# Patient Record
Sex: Female | Born: 1940
Health system: Southern US, Community
[De-identification: ages and names within clinical notes are randomized; demographics above are authoritative.]

## PROBLEM LIST (undated history)

## (undated) DIAGNOSIS — G309 Alzheimer's disease, unspecified: Secondary | ICD-10-CM

## (undated) DIAGNOSIS — I739 Peripheral vascular disease, unspecified: Secondary | ICD-10-CM

## (undated) DIAGNOSIS — E876 Hypokalemia: Secondary | ICD-10-CM

## (undated) DIAGNOSIS — Z86718 Personal history of other venous thrombosis and embolism: Secondary | ICD-10-CM

## (undated) DIAGNOSIS — Z7901 Long term (current) use of anticoagulants: Secondary | ICD-10-CM

## (undated) DIAGNOSIS — N3010 Interstitial cystitis (chronic) without hematuria: Secondary | ICD-10-CM

## (undated) DIAGNOSIS — I10 Essential (primary) hypertension: Secondary | ICD-10-CM

## (undated) DIAGNOSIS — J449 Chronic obstructive pulmonary disease, unspecified: Secondary | ICD-10-CM

## (undated) DIAGNOSIS — M810 Age-related osteoporosis without current pathological fracture: Secondary | ICD-10-CM

## (undated) DIAGNOSIS — R7303 Prediabetes: Secondary | ICD-10-CM

## (undated) DIAGNOSIS — F339 Major depressive disorder, recurrent, unspecified: Secondary | ICD-10-CM

## (undated) DIAGNOSIS — I499 Cardiac arrhythmia, unspecified: Secondary | ICD-10-CM

## (undated) HISTORY — DX: Alzheimer's disease, unspecified: G30.9

## (undated) HISTORY — DX: Essential (primary) hypertension: I10

## (undated) HISTORY — DX: Interstitial cystitis (chronic) without hematuria: N30.10

## (undated) HISTORY — DX: Major depressive disorder, recurrent, unspecified: F33.9

## (undated) HISTORY — DX: Chronic obstructive pulmonary disease, unspecified: J44.9

## (undated) HISTORY — DX: Peripheral vascular disease, unspecified: I73.9

## (undated) HISTORY — DX: Cardiac arrhythmia, unspecified: I49.9

## (undated) HISTORY — DX: Prediabetes: R73.03

## (undated) HISTORY — DX: Hypokalemia: E87.6

## (undated) HISTORY — DX: Long term (current) use of anticoagulants: Z79.01

## (undated) HISTORY — DX: Age-related osteoporosis without current pathological fracture: M81.0

## (undated) HISTORY — DX: Personal history of other venous thrombosis and embolism: Z86.718

---
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
## 1963-09-20 HISTORY — PX: TUBAL LIGATION: SHX77

## 2010-09-19 HISTORY — PX: CHOLECYSTECTOMY: SHX55

## 2015-11-05 DIAGNOSIS — Z86718 Personal history of other venous thrombosis and embolism: Secondary | ICD-10-CM | POA: Diagnosis not present

## 2016-10-04 DIAGNOSIS — Z0000 Encounter for general adult medical examination without abnormal findings: Secondary | ICD-10-CM | POA: Diagnosis not present

## 2016-10-10 DIAGNOSIS — Z0000 Encounter for general adult medical examination without abnormal findings: Secondary | ICD-10-CM | POA: Diagnosis not present

## 2016-10-10 DIAGNOSIS — Z6830 Body mass index (BMI) 30.0-30.9, adult: Secondary | ICD-10-CM | POA: Diagnosis not present

## 2016-10-10 DIAGNOSIS — E669 Obesity, unspecified: Secondary | ICD-10-CM | POA: Diagnosis not present

## 2016-12-27 DIAGNOSIS — Z0100 Encounter for examination of eyes and vision without abnormal findings: Secondary | ICD-10-CM | POA: Diagnosis not present

## 2016-12-27 DIAGNOSIS — H25813 Combined forms of age-related cataract, bilateral: Secondary | ICD-10-CM | POA: Diagnosis not present

## 2016-12-27 DIAGNOSIS — H04123 Dry eye syndrome of bilateral lacrimal glands: Secondary | ICD-10-CM | POA: Diagnosis not present

## 2016-12-29 DIAGNOSIS — J309 Allergic rhinitis, unspecified: Secondary | ICD-10-CM | POA: Diagnosis not present

## 2016-12-29 DIAGNOSIS — R42 Dizziness and giddiness: Secondary | ICD-10-CM | POA: Diagnosis not present

## 2016-12-29 DIAGNOSIS — E785 Hyperlipidemia, unspecified: Secondary | ICD-10-CM | POA: Diagnosis not present

## 2016-12-29 DIAGNOSIS — E669 Obesity, unspecified: Secondary | ICD-10-CM | POA: Diagnosis not present

## 2016-12-29 DIAGNOSIS — I10 Essential (primary) hypertension: Secondary | ICD-10-CM | POA: Diagnosis not present

## 2016-12-29 DIAGNOSIS — Z6830 Body mass index (BMI) 30.0-30.9, adult: Secondary | ICD-10-CM | POA: Diagnosis not present

## 2016-12-29 DIAGNOSIS — E039 Hypothyroidism, unspecified: Secondary | ICD-10-CM | POA: Diagnosis not present

## 2017-01-23 DIAGNOSIS — Z1231 Encounter for screening mammogram for malignant neoplasm of breast: Secondary | ICD-10-CM | POA: Diagnosis not present

## 2017-01-26 DIAGNOSIS — M8588 Other specified disorders of bone density and structure, other site: Secondary | ICD-10-CM | POA: Diagnosis not present

## 2017-01-26 DIAGNOSIS — Z6830 Body mass index (BMI) 30.0-30.9, adult: Secondary | ICD-10-CM | POA: Diagnosis not present

## 2017-01-26 DIAGNOSIS — K219 Gastro-esophageal reflux disease without esophagitis: Secondary | ICD-10-CM | POA: Diagnosis not present

## 2017-01-26 DIAGNOSIS — M25512 Pain in left shoulder: Secondary | ICD-10-CM | POA: Diagnosis not present

## 2017-01-26 DIAGNOSIS — E785 Hyperlipidemia, unspecified: Secondary | ICD-10-CM | POA: Diagnosis not present

## 2017-01-26 DIAGNOSIS — I10 Essential (primary) hypertension: Secondary | ICD-10-CM | POA: Diagnosis not present

## 2017-01-26 DIAGNOSIS — E669 Obesity, unspecified: Secondary | ICD-10-CM | POA: Diagnosis not present

## 2017-01-26 DIAGNOSIS — E039 Hypothyroidism, unspecified: Secondary | ICD-10-CM | POA: Diagnosis not present

## 2017-01-26 DIAGNOSIS — E559 Vitamin D deficiency, unspecified: Secondary | ICD-10-CM | POA: Diagnosis not present

## 2017-02-10 DIAGNOSIS — N959 Unspecified menopausal and perimenopausal disorder: Secondary | ICD-10-CM | POA: Diagnosis not present

## 2017-02-10 DIAGNOSIS — M810 Age-related osteoporosis without current pathological fracture: Secondary | ICD-10-CM | POA: Diagnosis not present

## 2017-02-16 DIAGNOSIS — E663 Overweight: Secondary | ICD-10-CM | POA: Diagnosis not present

## 2017-02-16 DIAGNOSIS — E039 Hypothyroidism, unspecified: Secondary | ICD-10-CM | POA: Diagnosis not present

## 2017-02-16 DIAGNOSIS — J45909 Unspecified asthma, uncomplicated: Secondary | ICD-10-CM | POA: Diagnosis not present

## 2017-02-16 DIAGNOSIS — I10 Essential (primary) hypertension: Secondary | ICD-10-CM | POA: Diagnosis not present

## 2017-02-16 DIAGNOSIS — Z79899 Other long term (current) drug therapy: Secondary | ICD-10-CM | POA: Diagnosis not present

## 2017-02-16 DIAGNOSIS — E785 Hyperlipidemia, unspecified: Secondary | ICD-10-CM | POA: Diagnosis not present

## 2017-02-16 DIAGNOSIS — Z6828 Body mass index (BMI) 28.0-28.9, adult: Secondary | ICD-10-CM | POA: Diagnosis not present

## 2017-02-16 DIAGNOSIS — R69 Illness, unspecified: Secondary | ICD-10-CM | POA: Diagnosis not present

## 2017-03-29 DIAGNOSIS — M79672 Pain in left foot: Secondary | ICD-10-CM | POA: Diagnosis not present

## 2017-03-29 DIAGNOSIS — M79675 Pain in left toe(s): Secondary | ICD-10-CM | POA: Diagnosis not present

## 2017-03-29 DIAGNOSIS — E669 Obesity, unspecified: Secondary | ICD-10-CM | POA: Diagnosis not present

## 2017-03-29 DIAGNOSIS — Z79899 Other long term (current) drug therapy: Secondary | ICD-10-CM | POA: Diagnosis not present

## 2017-03-29 DIAGNOSIS — M7732 Calcaneal spur, left foot: Secondary | ICD-10-CM | POA: Diagnosis not present

## 2017-03-29 DIAGNOSIS — M7989 Other specified soft tissue disorders: Secondary | ICD-10-CM | POA: Diagnosis not present

## 2017-03-29 DIAGNOSIS — Z6828 Body mass index (BMI) 28.0-28.9, adult: Secondary | ICD-10-CM | POA: Diagnosis not present

## 2017-03-29 DIAGNOSIS — M810 Age-related osteoporosis without current pathological fracture: Secondary | ICD-10-CM | POA: Diagnosis not present

## 2017-03-29 DIAGNOSIS — E785 Hyperlipidemia, unspecified: Secondary | ICD-10-CM | POA: Diagnosis not present

## 2017-03-29 DIAGNOSIS — G4700 Insomnia, unspecified: Secondary | ICD-10-CM | POA: Diagnosis not present

## 2017-03-29 DIAGNOSIS — E039 Hypothyroidism, unspecified: Secondary | ICD-10-CM | POA: Diagnosis not present

## 2017-03-29 DIAGNOSIS — J309 Allergic rhinitis, unspecified: Secondary | ICD-10-CM | POA: Diagnosis not present

## 2017-03-29 DIAGNOSIS — J439 Emphysema, unspecified: Secondary | ICD-10-CM | POA: Diagnosis not present

## 2017-05-15 DIAGNOSIS — I10 Essential (primary) hypertension: Secondary | ICD-10-CM | POA: Diagnosis not present

## 2017-05-15 DIAGNOSIS — Z0000 Encounter for general adult medical examination without abnormal findings: Secondary | ICD-10-CM | POA: Diagnosis not present

## 2017-05-15 DIAGNOSIS — E039 Hypothyroidism, unspecified: Secondary | ICD-10-CM | POA: Diagnosis not present

## 2017-05-15 DIAGNOSIS — E7800 Pure hypercholesterolemia, unspecified: Secondary | ICD-10-CM | POA: Diagnosis not present

## 2017-05-15 DIAGNOSIS — K219 Gastro-esophageal reflux disease without esophagitis: Secondary | ICD-10-CM | POA: Diagnosis not present

## 2017-05-15 DIAGNOSIS — R69 Illness, unspecified: Secondary | ICD-10-CM | POA: Diagnosis not present

## 2017-05-15 DIAGNOSIS — H8111 Benign paroxysmal vertigo, right ear: Secondary | ICD-10-CM | POA: Diagnosis not present

## 2017-05-15 DIAGNOSIS — J302 Other seasonal allergic rhinitis: Secondary | ICD-10-CM | POA: Diagnosis not present

## 2017-05-15 DIAGNOSIS — H9111 Presbycusis, right ear: Secondary | ICD-10-CM | POA: Diagnosis not present

## 2017-05-15 DIAGNOSIS — M0570 Rheumatoid arthritis with rheumatoid factor of unspecified site without organ or systems involvement: Secondary | ICD-10-CM | POA: Diagnosis not present

## 2017-06-19 DIAGNOSIS — Z23 Encounter for immunization: Secondary | ICD-10-CM | POA: Diagnosis not present

## 2017-06-19 DIAGNOSIS — H7291 Unspecified perforation of tympanic membrane, right ear: Secondary | ICD-10-CM | POA: Diagnosis not present

## 2017-06-19 DIAGNOSIS — Z6827 Body mass index (BMI) 27.0-27.9, adult: Secondary | ICD-10-CM | POA: Diagnosis not present

## 2017-07-31 DIAGNOSIS — E559 Vitamin D deficiency, unspecified: Secondary | ICD-10-CM | POA: Diagnosis not present

## 2017-07-31 DIAGNOSIS — M810 Age-related osteoporosis without current pathological fracture: Secondary | ICD-10-CM | POA: Diagnosis not present

## 2017-07-31 DIAGNOSIS — Z6827 Body mass index (BMI) 27.0-27.9, adult: Secondary | ICD-10-CM | POA: Diagnosis not present

## 2017-07-31 DIAGNOSIS — E669 Obesity, unspecified: Secondary | ICD-10-CM | POA: Diagnosis not present

## 2017-07-31 DIAGNOSIS — M79662 Pain in left lower leg: Secondary | ICD-10-CM | POA: Diagnosis not present

## 2017-07-31 DIAGNOSIS — E039 Hypothyroidism, unspecified: Secondary | ICD-10-CM | POA: Diagnosis not present

## 2017-07-31 DIAGNOSIS — E785 Hyperlipidemia, unspecified: Secondary | ICD-10-CM | POA: Diagnosis not present

## 2017-07-31 DIAGNOSIS — I10 Essential (primary) hypertension: Secondary | ICD-10-CM | POA: Diagnosis not present

## 2017-07-31 DIAGNOSIS — K219 Gastro-esophageal reflux disease without esophagitis: Secondary | ICD-10-CM | POA: Diagnosis not present

## 2017-07-31 DIAGNOSIS — Z23 Encounter for immunization: Secondary | ICD-10-CM | POA: Diagnosis not present

## 2017-08-04 DIAGNOSIS — M79605 Pain in left leg: Secondary | ICD-10-CM | POA: Diagnosis not present

## 2017-08-04 DIAGNOSIS — M7989 Other specified soft tissue disorders: Secondary | ICD-10-CM | POA: Diagnosis not present

## 2017-10-21 DIAGNOSIS — M25562 Pain in left knee: Secondary | ICD-10-CM | POA: Diagnosis not present

## 2017-10-21 DIAGNOSIS — W19XXXA Unspecified fall, initial encounter: Secondary | ICD-10-CM | POA: Diagnosis not present

## 2017-10-21 DIAGNOSIS — S0512XA Contusion of eyeball and orbital tissues, left eye, initial encounter: Secondary | ICD-10-CM | POA: Diagnosis not present

## 2017-10-21 DIAGNOSIS — S8002XA Contusion of left knee, initial encounter: Secondary | ICD-10-CM | POA: Diagnosis not present

## 2017-10-21 DIAGNOSIS — S0990XA Unspecified injury of head, initial encounter: Secondary | ICD-10-CM | POA: Diagnosis not present

## 2017-10-21 DIAGNOSIS — S8992XA Unspecified injury of left lower leg, initial encounter: Secondary | ICD-10-CM | POA: Diagnosis not present

## 2017-10-21 DIAGNOSIS — S50312A Abrasion of left elbow, initial encounter: Secondary | ICD-10-CM | POA: Diagnosis not present

## 2017-10-22 DIAGNOSIS — S0990XA Unspecified injury of head, initial encounter: Secondary | ICD-10-CM | POA: Diagnosis not present

## 2017-10-22 DIAGNOSIS — M25562 Pain in left knee: Secondary | ICD-10-CM | POA: Diagnosis not present

## 2017-10-22 DIAGNOSIS — S8992XA Unspecified injury of left lower leg, initial encounter: Secondary | ICD-10-CM | POA: Diagnosis not present

## 2017-11-28 DIAGNOSIS — Z6826 Body mass index (BMI) 26.0-26.9, adult: Secondary | ICD-10-CM | POA: Diagnosis not present

## 2017-11-28 DIAGNOSIS — E039 Hypothyroidism, unspecified: Secondary | ICD-10-CM | POA: Diagnosis not present

## 2017-11-28 DIAGNOSIS — J309 Allergic rhinitis, unspecified: Secondary | ICD-10-CM | POA: Diagnosis not present

## 2017-11-28 DIAGNOSIS — E663 Overweight: Secondary | ICD-10-CM | POA: Diagnosis not present

## 2017-11-28 DIAGNOSIS — J45909 Unspecified asthma, uncomplicated: Secondary | ICD-10-CM | POA: Diagnosis not present

## 2017-11-28 DIAGNOSIS — Z833 Family history of diabetes mellitus: Secondary | ICD-10-CM | POA: Diagnosis not present

## 2017-11-28 DIAGNOSIS — E785 Hyperlipidemia, unspecified: Secondary | ICD-10-CM | POA: Diagnosis not present

## 2017-11-28 DIAGNOSIS — J439 Emphysema, unspecified: Secondary | ICD-10-CM | POA: Diagnosis not present

## 2017-11-28 DIAGNOSIS — I10 Essential (primary) hypertension: Secondary | ICD-10-CM | POA: Diagnosis not present

## 2017-11-28 DIAGNOSIS — M25552 Pain in left hip: Secondary | ICD-10-CM | POA: Diagnosis not present

## 2017-11-28 DIAGNOSIS — R413 Other amnesia: Secondary | ICD-10-CM | POA: Diagnosis not present

## 2017-11-28 DIAGNOSIS — R739 Hyperglycemia, unspecified: Secondary | ICD-10-CM | POA: Diagnosis not present

## 2017-11-28 DIAGNOSIS — Z1331 Encounter for screening for depression: Secondary | ICD-10-CM | POA: Diagnosis not present

## 2017-11-28 DIAGNOSIS — R202 Paresthesia of skin: Secondary | ICD-10-CM | POA: Diagnosis not present

## 2017-11-28 DIAGNOSIS — Z79899 Other long term (current) drug therapy: Secondary | ICD-10-CM | POA: Diagnosis not present

## 2017-11-28 DIAGNOSIS — R69 Illness, unspecified: Secondary | ICD-10-CM | POA: Diagnosis not present

## 2017-11-28 DIAGNOSIS — M1612 Unilateral primary osteoarthritis, left hip: Secondary | ICD-10-CM | POA: Diagnosis not present

## 2018-01-01 DIAGNOSIS — H524 Presbyopia: Secondary | ICD-10-CM | POA: Diagnosis not present

## 2018-01-16 DIAGNOSIS — J209 Acute bronchitis, unspecified: Secondary | ICD-10-CM | POA: Diagnosis not present

## 2018-01-16 DIAGNOSIS — Z86718 Personal history of other venous thrombosis and embolism: Secondary | ICD-10-CM | POA: Diagnosis not present

## 2018-01-16 DIAGNOSIS — J019 Acute sinusitis, unspecified: Secondary | ICD-10-CM | POA: Diagnosis not present

## 2018-01-16 DIAGNOSIS — E663 Overweight: Secondary | ICD-10-CM | POA: Diagnosis not present

## 2018-01-16 DIAGNOSIS — J309 Allergic rhinitis, unspecified: Secondary | ICD-10-CM | POA: Diagnosis not present

## 2018-01-16 DIAGNOSIS — Z6826 Body mass index (BMI) 26.0-26.9, adult: Secondary | ICD-10-CM | POA: Diagnosis not present

## 2018-02-13 DIAGNOSIS — J439 Emphysema, unspecified: Secondary | ICD-10-CM | POA: Diagnosis not present

## 2018-02-13 DIAGNOSIS — H04129 Dry eye syndrome of unspecified lacrimal gland: Secondary | ICD-10-CM | POA: Diagnosis not present

## 2018-02-13 DIAGNOSIS — E039 Hypothyroidism, unspecified: Secondary | ICD-10-CM | POA: Diagnosis not present

## 2018-02-13 DIAGNOSIS — R69 Illness, unspecified: Secondary | ICD-10-CM | POA: Diagnosis not present

## 2018-02-13 DIAGNOSIS — K08409 Partial loss of teeth, unspecified cause, unspecified class: Secondary | ICD-10-CM | POA: Diagnosis not present

## 2018-02-13 DIAGNOSIS — J309 Allergic rhinitis, unspecified: Secondary | ICD-10-CM | POA: Diagnosis not present

## 2018-02-13 DIAGNOSIS — E785 Hyperlipidemia, unspecified: Secondary | ICD-10-CM | POA: Diagnosis not present

## 2018-02-13 DIAGNOSIS — G8929 Other chronic pain: Secondary | ICD-10-CM | POA: Diagnosis not present

## 2018-02-13 DIAGNOSIS — M069 Rheumatoid arthritis, unspecified: Secondary | ICD-10-CM | POA: Diagnosis not present

## 2018-03-26 DIAGNOSIS — Z1231 Encounter for screening mammogram for malignant neoplasm of breast: Secondary | ICD-10-CM | POA: Diagnosis not present

## 2018-03-29 DIAGNOSIS — I10 Essential (primary) hypertension: Secondary | ICD-10-CM | POA: Diagnosis not present

## 2018-03-29 DIAGNOSIS — Z1339 Encounter for screening examination for other mental health and behavioral disorders: Secondary | ICD-10-CM | POA: Diagnosis not present

## 2018-03-29 DIAGNOSIS — R69 Illness, unspecified: Secondary | ICD-10-CM | POA: Diagnosis not present

## 2018-03-29 DIAGNOSIS — E785 Hyperlipidemia, unspecified: Secondary | ICD-10-CM | POA: Diagnosis not present

## 2018-03-29 DIAGNOSIS — Z6826 Body mass index (BMI) 26.0-26.9, adult: Secondary | ICD-10-CM | POA: Diagnosis not present

## 2018-03-29 DIAGNOSIS — R7303 Prediabetes: Secondary | ICD-10-CM | POA: Diagnosis not present

## 2018-03-29 DIAGNOSIS — E039 Hypothyroidism, unspecified: Secondary | ICD-10-CM | POA: Diagnosis not present

## 2018-03-29 DIAGNOSIS — E663 Overweight: Secondary | ICD-10-CM | POA: Diagnosis not present

## 2018-03-29 DIAGNOSIS — R413 Other amnesia: Secondary | ICD-10-CM | POA: Diagnosis not present

## 2018-03-29 DIAGNOSIS — J309 Allergic rhinitis, unspecified: Secondary | ICD-10-CM | POA: Diagnosis not present

## 2018-07-31 DIAGNOSIS — Z79899 Other long term (current) drug therapy: Secondary | ICD-10-CM | POA: Diagnosis not present

## 2018-07-31 DIAGNOSIS — I10 Essential (primary) hypertension: Secondary | ICD-10-CM | POA: Diagnosis not present

## 2018-07-31 DIAGNOSIS — E039 Hypothyroidism, unspecified: Secondary | ICD-10-CM | POA: Diagnosis not present

## 2018-07-31 DIAGNOSIS — Z23 Encounter for immunization: Secondary | ICD-10-CM | POA: Diagnosis not present

## 2018-07-31 DIAGNOSIS — Z6828 Body mass index (BMI) 28.0-28.9, adult: Secondary | ICD-10-CM | POA: Diagnosis not present

## 2018-07-31 DIAGNOSIS — E785 Hyperlipidemia, unspecified: Secondary | ICD-10-CM | POA: Diagnosis not present

## 2018-07-31 DIAGNOSIS — R69 Illness, unspecified: Secondary | ICD-10-CM | POA: Diagnosis not present

## 2018-07-31 DIAGNOSIS — M810 Age-related osteoporosis without current pathological fracture: Secondary | ICD-10-CM | POA: Diagnosis not present

## 2018-07-31 DIAGNOSIS — R7303 Prediabetes: Secondary | ICD-10-CM | POA: Diagnosis not present

## 2018-07-31 DIAGNOSIS — Z9181 History of falling: Secondary | ICD-10-CM | POA: Diagnosis not present

## 2018-09-17 DIAGNOSIS — R05 Cough: Secondary | ICD-10-CM | POA: Diagnosis not present

## 2018-09-17 DIAGNOSIS — Z6828 Body mass index (BMI) 28.0-28.9, adult: Secondary | ICD-10-CM | POA: Diagnosis not present

## 2018-09-17 DIAGNOSIS — J209 Acute bronchitis, unspecified: Secondary | ICD-10-CM | POA: Diagnosis not present

## 2018-09-17 DIAGNOSIS — R69 Illness, unspecified: Secondary | ICD-10-CM | POA: Diagnosis not present

## 2018-09-17 DIAGNOSIS — R42 Dizziness and giddiness: Secondary | ICD-10-CM | POA: Diagnosis not present

## 2018-09-17 DIAGNOSIS — R6889 Other general symptoms and signs: Secondary | ICD-10-CM | POA: Diagnosis not present

## 2018-09-19 HISTORY — PX: WRIST SURGERY: SHX841

## 2018-09-27 DIAGNOSIS — J209 Acute bronchitis, unspecified: Secondary | ICD-10-CM | POA: Diagnosis not present

## 2018-09-27 DIAGNOSIS — R05 Cough: Secondary | ICD-10-CM | POA: Diagnosis not present

## 2018-09-27 DIAGNOSIS — Z79899 Other long term (current) drug therapy: Secondary | ICD-10-CM | POA: Diagnosis not present

## 2018-11-29 DIAGNOSIS — R69 Illness, unspecified: Secondary | ICD-10-CM | POA: Diagnosis not present

## 2018-11-29 DIAGNOSIS — J439 Emphysema, unspecified: Secondary | ICD-10-CM | POA: Diagnosis not present

## 2018-11-29 DIAGNOSIS — K219 Gastro-esophageal reflux disease without esophagitis: Secondary | ICD-10-CM | POA: Diagnosis not present

## 2018-11-29 DIAGNOSIS — M069 Rheumatoid arthritis, unspecified: Secondary | ICD-10-CM | POA: Diagnosis not present

## 2018-11-29 DIAGNOSIS — M810 Age-related osteoporosis without current pathological fracture: Secondary | ICD-10-CM | POA: Diagnosis not present

## 2018-11-29 DIAGNOSIS — E785 Hyperlipidemia, unspecified: Secondary | ICD-10-CM | POA: Diagnosis not present

## 2018-11-29 DIAGNOSIS — E559 Vitamin D deficiency, unspecified: Secondary | ICD-10-CM | POA: Diagnosis not present

## 2018-11-29 DIAGNOSIS — R7303 Prediabetes: Secondary | ICD-10-CM | POA: Diagnosis not present

## 2018-11-29 DIAGNOSIS — E039 Hypothyroidism, unspecified: Secondary | ICD-10-CM | POA: Diagnosis not present

## 2018-11-29 DIAGNOSIS — I10 Essential (primary) hypertension: Secondary | ICD-10-CM | POA: Diagnosis not present

## 2018-12-06 DIAGNOSIS — L578 Other skin changes due to chronic exposure to nonionizing radiation: Secondary | ICD-10-CM | POA: Diagnosis not present

## 2018-12-06 DIAGNOSIS — L814 Other melanin hyperpigmentation: Secondary | ICD-10-CM | POA: Diagnosis not present

## 2018-12-06 DIAGNOSIS — D1801 Hemangioma of skin and subcutaneous tissue: Secondary | ICD-10-CM | POA: Diagnosis not present

## 2018-12-06 DIAGNOSIS — D485 Neoplasm of uncertain behavior of skin: Secondary | ICD-10-CM | POA: Diagnosis not present

## 2019-02-13 DIAGNOSIS — J029 Acute pharyngitis, unspecified: Secondary | ICD-10-CM | POA: Diagnosis not present

## 2019-03-05 DIAGNOSIS — E039 Hypothyroidism, unspecified: Secondary | ICD-10-CM | POA: Diagnosis not present

## 2019-03-05 DIAGNOSIS — K219 Gastro-esophageal reflux disease without esophagitis: Secondary | ICD-10-CM | POA: Diagnosis not present

## 2019-03-05 DIAGNOSIS — E785 Hyperlipidemia, unspecified: Secondary | ICD-10-CM | POA: Diagnosis not present

## 2019-03-05 DIAGNOSIS — I10 Essential (primary) hypertension: Secondary | ICD-10-CM | POA: Diagnosis not present

## 2019-03-05 DIAGNOSIS — G4700 Insomnia, unspecified: Secondary | ICD-10-CM | POA: Diagnosis not present

## 2019-03-05 DIAGNOSIS — R69 Illness, unspecified: Secondary | ICD-10-CM | POA: Diagnosis not present

## 2019-03-05 DIAGNOSIS — N649 Disorder of breast, unspecified: Secondary | ICD-10-CM | POA: Diagnosis not present

## 2019-03-05 DIAGNOSIS — R239 Unspecified skin changes: Secondary | ICD-10-CM | POA: Diagnosis not present

## 2019-03-05 DIAGNOSIS — L989 Disorder of the skin and subcutaneous tissue, unspecified: Secondary | ICD-10-CM | POA: Diagnosis not present

## 2019-03-05 DIAGNOSIS — R42 Dizziness and giddiness: Secondary | ICD-10-CM | POA: Diagnosis not present

## 2019-03-08 DIAGNOSIS — Z79899 Other long term (current) drug therapy: Secondary | ICD-10-CM | POA: Diagnosis not present

## 2019-03-08 DIAGNOSIS — E785 Hyperlipidemia, unspecified: Secondary | ICD-10-CM | POA: Diagnosis not present

## 2019-03-08 DIAGNOSIS — E559 Vitamin D deficiency, unspecified: Secondary | ICD-10-CM | POA: Diagnosis not present

## 2019-03-08 DIAGNOSIS — E039 Hypothyroidism, unspecified: Secondary | ICD-10-CM | POA: Diagnosis not present

## 2019-04-01 DIAGNOSIS — Z1231 Encounter for screening mammogram for malignant neoplasm of breast: Secondary | ICD-10-CM | POA: Diagnosis not present

## 2019-04-05 DIAGNOSIS — Z9181 History of falling: Secondary | ICD-10-CM | POA: Diagnosis not present

## 2019-04-05 DIAGNOSIS — Z139 Encounter for screening, unspecified: Secondary | ICD-10-CM | POA: Diagnosis not present

## 2019-04-05 DIAGNOSIS — Z1331 Encounter for screening for depression: Secondary | ICD-10-CM | POA: Diagnosis not present

## 2019-04-05 DIAGNOSIS — Z0000 Encounter for general adult medical examination without abnormal findings: Secondary | ICD-10-CM | POA: Diagnosis not present

## 2019-04-05 DIAGNOSIS — E785 Hyperlipidemia, unspecified: Secondary | ICD-10-CM | POA: Diagnosis not present

## 2019-04-29 DIAGNOSIS — J45909 Unspecified asthma, uncomplicated: Secondary | ICD-10-CM | POA: Diagnosis not present

## 2019-04-29 DIAGNOSIS — R7303 Prediabetes: Secondary | ICD-10-CM | POA: Diagnosis not present

## 2019-04-29 DIAGNOSIS — Z131 Encounter for screening for diabetes mellitus: Secondary | ICD-10-CM | POA: Diagnosis not present

## 2019-04-29 DIAGNOSIS — G4700 Insomnia, unspecified: Secondary | ICD-10-CM | POA: Diagnosis not present

## 2019-04-29 DIAGNOSIS — E559 Vitamin D deficiency, unspecified: Secondary | ICD-10-CM | POA: Diagnosis not present

## 2019-04-29 DIAGNOSIS — E039 Hypothyroidism, unspecified: Secondary | ICD-10-CM | POA: Diagnosis not present

## 2019-04-29 DIAGNOSIS — Z6828 Body mass index (BMI) 28.0-28.9, adult: Secondary | ICD-10-CM | POA: Diagnosis not present

## 2019-04-29 DIAGNOSIS — E663 Overweight: Secondary | ICD-10-CM | POA: Diagnosis not present

## 2019-04-29 DIAGNOSIS — J309 Allergic rhinitis, unspecified: Secondary | ICD-10-CM | POA: Diagnosis not present

## 2019-05-28 DIAGNOSIS — R635 Abnormal weight gain: Secondary | ICD-10-CM | POA: Diagnosis not present

## 2019-05-28 DIAGNOSIS — E039 Hypothyroidism, unspecified: Secondary | ICD-10-CM | POA: Diagnosis not present

## 2019-05-28 DIAGNOSIS — E559 Vitamin D deficiency, unspecified: Secondary | ICD-10-CM | POA: Diagnosis not present

## 2019-05-31 DIAGNOSIS — E7800 Pure hypercholesterolemia, unspecified: Secondary | ICD-10-CM | POA: Diagnosis not present

## 2019-05-31 DIAGNOSIS — E559 Vitamin D deficiency, unspecified: Secondary | ICD-10-CM | POA: Diagnosis not present

## 2019-05-31 DIAGNOSIS — K219 Gastro-esophageal reflux disease without esophagitis: Secondary | ICD-10-CM | POA: Diagnosis not present

## 2019-05-31 DIAGNOSIS — Z1331 Encounter for screening for depression: Secondary | ICD-10-CM | POA: Diagnosis not present

## 2019-05-31 DIAGNOSIS — E039 Hypothyroidism, unspecified: Secondary | ICD-10-CM | POA: Diagnosis not present

## 2019-05-31 DIAGNOSIS — Z6829 Body mass index (BMI) 29.0-29.9, adult: Secondary | ICD-10-CM | POA: Diagnosis not present

## 2019-05-31 DIAGNOSIS — Z1339 Encounter for screening examination for other mental health and behavioral disorders: Secondary | ICD-10-CM | POA: Diagnosis not present

## 2019-05-31 DIAGNOSIS — N951 Menopausal and female climacteric states: Secondary | ICD-10-CM | POA: Diagnosis not present

## 2019-06-04 DIAGNOSIS — E559 Vitamin D deficiency, unspecified: Secondary | ICD-10-CM | POA: Diagnosis not present

## 2019-06-04 DIAGNOSIS — Z79899 Other long term (current) drug therapy: Secondary | ICD-10-CM | POA: Diagnosis not present

## 2019-06-04 DIAGNOSIS — E538 Deficiency of other specified B group vitamins: Secondary | ICD-10-CM | POA: Diagnosis not present

## 2019-06-04 DIAGNOSIS — M810 Age-related osteoporosis without current pathological fracture: Secondary | ICD-10-CM | POA: Diagnosis not present

## 2019-06-04 DIAGNOSIS — Z780 Asymptomatic menopausal state: Secondary | ICD-10-CM | POA: Diagnosis not present

## 2019-06-04 DIAGNOSIS — E039 Hypothyroidism, unspecified: Secondary | ICD-10-CM | POA: Diagnosis not present

## 2019-06-04 DIAGNOSIS — J309 Allergic rhinitis, unspecified: Secondary | ICD-10-CM | POA: Diagnosis not present

## 2019-06-13 DIAGNOSIS — S79911A Unspecified injury of right hip, initial encounter: Secondary | ICD-10-CM | POA: Diagnosis not present

## 2019-06-13 DIAGNOSIS — E7800 Pure hypercholesterolemia, unspecified: Secondary | ICD-10-CM | POA: Diagnosis not present

## 2019-06-13 DIAGNOSIS — W19XXXA Unspecified fall, initial encounter: Secondary | ICD-10-CM | POA: Diagnosis not present

## 2019-06-13 DIAGNOSIS — M25551 Pain in right hip: Secondary | ICD-10-CM | POA: Diagnosis not present

## 2019-06-13 DIAGNOSIS — Z6829 Body mass index (BMI) 29.0-29.9, adult: Secondary | ICD-10-CM | POA: Diagnosis not present

## 2019-06-19 DIAGNOSIS — Z6829 Body mass index (BMI) 29.0-29.9, adult: Secondary | ICD-10-CM | POA: Diagnosis not present

## 2019-06-19 DIAGNOSIS — E559 Vitamin D deficiency, unspecified: Secondary | ICD-10-CM | POA: Diagnosis not present

## 2019-07-03 DIAGNOSIS — E7800 Pure hypercholesterolemia, unspecified: Secondary | ICD-10-CM | POA: Diagnosis not present

## 2019-07-03 DIAGNOSIS — N951 Menopausal and female climacteric states: Secondary | ICD-10-CM | POA: Diagnosis not present

## 2019-07-03 DIAGNOSIS — Z6828 Body mass index (BMI) 28.0-28.9, adult: Secondary | ICD-10-CM | POA: Diagnosis not present

## 2019-07-09 DIAGNOSIS — M25551 Pain in right hip: Secondary | ICD-10-CM | POA: Diagnosis not present

## 2019-07-09 DIAGNOSIS — S79911A Unspecified injury of right hip, initial encounter: Secondary | ICD-10-CM | POA: Diagnosis not present

## 2019-07-09 DIAGNOSIS — S8391XA Sprain of unspecified site of right knee, initial encounter: Secondary | ICD-10-CM | POA: Diagnosis not present

## 2019-07-09 DIAGNOSIS — W19XXXA Unspecified fall, initial encounter: Secondary | ICD-10-CM | POA: Diagnosis not present

## 2019-07-09 DIAGNOSIS — S83511A Sprain of anterior cruciate ligament of right knee, initial encounter: Secondary | ICD-10-CM | POA: Diagnosis not present

## 2019-07-09 DIAGNOSIS — R52 Pain, unspecified: Secondary | ICD-10-CM | POA: Diagnosis not present

## 2019-07-09 DIAGNOSIS — M25461 Effusion, right knee: Secondary | ICD-10-CM | POA: Diagnosis not present

## 2019-07-09 DIAGNOSIS — R0902 Hypoxemia: Secondary | ICD-10-CM | POA: Diagnosis not present

## 2019-07-09 DIAGNOSIS — M25561 Pain in right knee: Secondary | ICD-10-CM | POA: Diagnosis not present

## 2019-07-16 DIAGNOSIS — M1711 Unilateral primary osteoarthritis, right knee: Secondary | ICD-10-CM | POA: Diagnosis not present

## 2019-07-17 DIAGNOSIS — E039 Hypothyroidism, unspecified: Secondary | ICD-10-CM | POA: Diagnosis not present

## 2019-07-17 DIAGNOSIS — Z6828 Body mass index (BMI) 28.0-28.9, adult: Secondary | ICD-10-CM | POA: Diagnosis not present

## 2019-07-24 DIAGNOSIS — E039 Hypothyroidism, unspecified: Secondary | ICD-10-CM | POA: Diagnosis not present

## 2019-07-24 DIAGNOSIS — Z6828 Body mass index (BMI) 28.0-28.9, adult: Secondary | ICD-10-CM | POA: Diagnosis not present

## 2019-07-31 DIAGNOSIS — Z6828 Body mass index (BMI) 28.0-28.9, adult: Secondary | ICD-10-CM | POA: Diagnosis not present

## 2019-07-31 DIAGNOSIS — E7800 Pure hypercholesterolemia, unspecified: Secondary | ICD-10-CM | POA: Diagnosis not present

## 2019-07-31 DIAGNOSIS — E559 Vitamin D deficiency, unspecified: Secondary | ICD-10-CM | POA: Diagnosis not present

## 2019-08-07 DIAGNOSIS — E7800 Pure hypercholesterolemia, unspecified: Secondary | ICD-10-CM | POA: Diagnosis not present

## 2019-08-07 DIAGNOSIS — Z6828 Body mass index (BMI) 28.0-28.9, adult: Secondary | ICD-10-CM | POA: Diagnosis not present

## 2019-08-09 DIAGNOSIS — Z23 Encounter for immunization: Secondary | ICD-10-CM | POA: Diagnosis not present

## 2019-08-14 DIAGNOSIS — E7800 Pure hypercholesterolemia, unspecified: Secondary | ICD-10-CM | POA: Diagnosis not present

## 2019-08-14 DIAGNOSIS — Z6827 Body mass index (BMI) 27.0-27.9, adult: Secondary | ICD-10-CM | POA: Diagnosis not present

## 2019-08-20 DIAGNOSIS — E559 Vitamin D deficiency, unspecified: Secondary | ICD-10-CM | POA: Diagnosis not present

## 2019-08-20 DIAGNOSIS — Z6827 Body mass index (BMI) 27.0-27.9, adult: Secondary | ICD-10-CM | POA: Diagnosis not present

## 2019-08-28 DIAGNOSIS — K219 Gastro-esophageal reflux disease without esophagitis: Secondary | ICD-10-CM | POA: Diagnosis not present

## 2019-08-28 DIAGNOSIS — Z6827 Body mass index (BMI) 27.0-27.9, adult: Secondary | ICD-10-CM | POA: Diagnosis not present

## 2019-08-28 DIAGNOSIS — R232 Flushing: Secondary | ICD-10-CM | POA: Diagnosis not present

## 2019-08-28 DIAGNOSIS — Z713 Dietary counseling and surveillance: Secondary | ICD-10-CM | POA: Diagnosis not present

## 2019-08-28 DIAGNOSIS — E7800 Pure hypercholesterolemia, unspecified: Secondary | ICD-10-CM | POA: Diagnosis not present

## 2019-09-04 DIAGNOSIS — R69 Illness, unspecified: Secondary | ICD-10-CM | POA: Diagnosis not present

## 2019-09-04 DIAGNOSIS — Z6827 Body mass index (BMI) 27.0-27.9, adult: Secondary | ICD-10-CM | POA: Diagnosis not present

## 2019-09-04 DIAGNOSIS — E7800 Pure hypercholesterolemia, unspecified: Secondary | ICD-10-CM | POA: Diagnosis not present

## 2019-09-16 ENCOUNTER — Emergency Department (HOSPITAL_COMMUNITY): Payer: Medicare HMO

## 2019-09-16 ENCOUNTER — Other Ambulatory Visit: Payer: Self-pay

## 2019-09-16 ENCOUNTER — Emergency Department (HOSPITAL_COMMUNITY)
Admission: EM | Admit: 2019-09-16 | Discharge: 2019-09-16 | Disposition: A | Discharge status: Home / Self Care | Payer: Medicare HMO | Attending: Emergency Medicine | Admitting: Emergency Medicine

## 2019-09-16 DIAGNOSIS — S6991XA Unspecified injury of right wrist, hand and finger(s), initial encounter: Secondary | ICD-10-CM | POA: Diagnosis present

## 2019-09-16 DIAGNOSIS — R52 Pain, unspecified: Secondary | ICD-10-CM | POA: Diagnosis not present

## 2019-09-16 DIAGNOSIS — S52591A Other fractures of lower end of right radius, initial encounter for closed fracture: Secondary | ICD-10-CM | POA: Diagnosis not present

## 2019-09-16 DIAGNOSIS — S52122A Displaced fracture of head of left radius, initial encounter for closed fracture: Secondary | ICD-10-CM | POA: Diagnosis not present

## 2019-09-16 DIAGNOSIS — S0990XA Unspecified injury of head, initial encounter: Secondary | ICD-10-CM | POA: Diagnosis not present

## 2019-09-16 DIAGNOSIS — R0902 Hypoxemia: Secondary | ICD-10-CM | POA: Diagnosis not present

## 2019-09-16 DIAGNOSIS — S82391A Other fracture of lower end of right tibia, initial encounter for closed fracture: Secondary | ICD-10-CM | POA: Diagnosis not present

## 2019-09-16 DIAGNOSIS — Y92198 Other place in other specified residential institution as the place of occurrence of the external cause: Secondary | ICD-10-CM | POA: Diagnosis not present

## 2019-09-16 DIAGNOSIS — W19XXXA Unspecified fall, initial encounter: Secondary | ICD-10-CM | POA: Diagnosis not present

## 2019-09-16 DIAGNOSIS — S0003XA Contusion of scalp, initial encounter: Secondary | ICD-10-CM | POA: Diagnosis not present

## 2019-09-16 DIAGNOSIS — W01198A Fall on same level from slipping, tripping and stumbling with subsequent striking against other object, initial encounter: Secondary | ICD-10-CM | POA: Insufficient documentation

## 2019-09-16 DIAGNOSIS — S62101A Fracture of unspecified carpal bone, right wrist, initial encounter for closed fracture: Secondary | ICD-10-CM | POA: Diagnosis not present

## 2019-09-16 DIAGNOSIS — Y999 Unspecified external cause status: Secondary | ICD-10-CM | POA: Insufficient documentation

## 2019-09-16 DIAGNOSIS — S0083XA Contusion of other part of head, initial encounter: Secondary | ICD-10-CM | POA: Diagnosis not present

## 2019-09-16 DIAGNOSIS — Y939 Activity, unspecified: Secondary | ICD-10-CM | POA: Diagnosis not present

## 2019-09-16 DIAGNOSIS — S199XXA Unspecified injury of neck, initial encounter: Secondary | ICD-10-CM | POA: Diagnosis not present

## 2019-09-16 DIAGNOSIS — S52501A Unspecified fracture of the lower end of right radius, initial encounter for closed fracture: Secondary | ICD-10-CM | POA: Diagnosis not present

## 2019-09-16 DIAGNOSIS — R609 Edema, unspecified: Secondary | ICD-10-CM | POA: Diagnosis not present

## 2019-09-16 DIAGNOSIS — S0081XA Abrasion of other part of head, initial encounter: Secondary | ICD-10-CM | POA: Diagnosis not present

## 2019-09-16 MED ORDER — ONDANSETRON HCL 4 MG/2ML IJ SOLN
4.0000 mg | Freq: Once | INTRAMUSCULAR | Status: AC
  Administered 2019-09-16: 4 mg via INTRAVENOUS
  Filled 2019-09-16: qty 2

## 2019-09-16 MED ORDER — PROPOFOL 10 MG/ML IV BOLUS
0.5000 mg/kg | Freq: Once | INTRAVENOUS | Status: DC
  Filled 2019-09-16: qty 20

## 2019-09-16 MED ORDER — OXYCODONE HCL 5 MG PO TABS: 5.0000 mg | ORAL_TABLET | Freq: Four times a day (QID) | ORAL | 0 refills | Status: DC | PRN

## 2019-09-16 MED ORDER — LIDOCAINE HCL (PF) 1 % IJ SOLN
5.0000 mL | Freq: Once | INTRAMUSCULAR | Status: AC
  Administered 2019-09-16: 5 mL
  Filled 2019-09-16: qty 5

## 2019-09-16 MED ORDER — SODIUM CHLORIDE 0.9 % IV BOLUS: 1000.0000 mL | Freq: Once | INTRAVENOUS | Status: DC

## 2019-09-16 MED ORDER — OXYCODONE HCL 5 MG PO TABS
5.0000 mg | ORAL_TABLET | Freq: Once | ORAL | Status: AC
  Administered 2019-09-16: 5 mg via ORAL
  Filled 2019-09-16: qty 1

## 2019-09-16 NOTE — Discharge Instructions (Signed)
Follow-up with Dr. Lenon Curt for wrist fracture.  Follow-up with your primary care doctor for CT scan of your chest to further evaluate for pulmonary nodule as we discussed.  Use Tylenol for pain use narcotic pain medicine for breakthrough pain.

## 2019-09-16 NOTE — Progress Notes (Signed)
Orthopedic Tech Progress Note Patient Details:  Brittany Aguilar 04-27-1941 [id number] APPLIED A SUGARTONG SPLINT ON WHILE SHE WAS STILL IN THE FINGER TRAP PER MD REQUEST. Ortho Devices Type of Ortho Device: Arm sling, Finger trap, Sugartong splint Ortho Device/Splint Location: RUE Ortho Device/Splint Interventions: Adjustment, Application, Ordered   Post Interventions Patient Tolerated: Well Instructions Provided: Care of device, Adjustment of device   Janit Pagan 09/16/2019, 5:54 PM

## 2019-09-16 NOTE — ED Notes (Signed)
Pt unable to sign esignature pad due to being right handed, and having right hand fracture

## 2019-09-16 NOTE — ED Provider Notes (Signed)
[facility] Provider Note   CSN: [id number] Arrival date & time: 09/16/19  1433     History Chief Complaint  Patient presents with  . Wrist Pain    Brittany Aguilar is a 78 y.o. female.  Patient with mechanical fall prior to arrival.  Hit her head on a tree and landed on her right wrist.  Has deformity to the right wrist.  Denies any loss consciousness.  Patient is not on blood thinners.  The history is provided by the patient.  Wrist Pain This is a new problem. The current episode started 1 to 2 hours ago. The problem occurs constantly. The problem has not changed since onset.Pertinent negatives include no chest pain, no abdominal pain, no headaches and no shortness of breath. Nothing aggravates the symptoms. Nothing relieves the symptoms. She has tried nothing for the symptoms. The treatment provided no relief.       No past medical history on file.  There are no problems to display for this patient.     OB History   No obstetric history on file.     No family history on file.  Social History   Tobacco Use  . Smoking status: Not on file  Substance Use Topics  . Alcohol use: Not on file  . Drug use: Not on file    Home Medications Prior to Admission medications   Not on File    Allergies    Oxycodone  Review of Systems   Review of Systems  Constitutional: Negative for chills and fever.  HENT: Negative for ear pain and sore throat.   Eyes: Negative for pain and visual disturbance.  Respiratory: Negative for cough and shortness of breath.   Cardiovascular: Negative for chest pain and palpitations.  Gastrointestinal: Negative for abdominal pain and vomiting.  Genitourinary: Negative for dysuria and hematuria.  Musculoskeletal: Positive for arthralgias. Negative for back pain.  Skin: Positive for wound. Negative for color change and rash.  Neurological: Positive for numbness (right hand tingling ). Negative for  dizziness, tremors, seizures, syncope, facial asymmetry, speech difficulty, weakness, light-headedness and headaches.  All other systems reviewed and are negative.   Physical Exam Updated Vital Signs  ED Triage Vitals  Enc Vitals Group     BP 09/16/19 1439 (!) 154/48     Pulse Rate 09/16/19 1439 (!) 54     Resp 09/16/19 1439 14     Temp 09/16/19 1439 98.4 F (36.9 C)     Temp Source 09/16/19 1439 Oral     SpO2 09/16/19 1439 98 %     Weight --      Height --      Head Circumference --      Peak Flow --      Pain Score 09/16/19 1438 10     Pain Loc --      Pain Edu? --      Excl. in [place]? --     Physical Exam Vitals and nursing note reviewed.  Constitutional:      General: She is not in acute distress.    Appearance: She is well-developed.  HENT:     Head:     Comments: Bruising and swelling to the right forehead with abrasion    Nose: Nose normal.     Mouth/Throat:     Mouth: Mucous membranes are moist.  Eyes:     Extraocular Movements: Extraocular movements intact.     Conjunctiva/sclera: Conjunctivae normal.  Pupils: Pupils are equal, round, and reactive to light.  Cardiovascular:     Rate and Rhythm: Normal rate and regular rhythm.     Pulses: Normal pulses.     Heart sounds: Normal heart sounds. No murmur.  Pulmonary:     Effort: Pulmonary effort is normal. No respiratory distress.     Breath sounds: Normal breath sounds.  Abdominal:     Palpations: Abdomen is soft.     Tenderness: There is no abdominal tenderness.  Musculoskeletal:        General: Swelling, tenderness and deformity present.     Cervical back: Normal range of motion and neck supple.     Comments: Bruising and swelling to right wrist with deformity, no midline spinal tenderness  Skin:    General: Skin is warm and dry.     Capillary Refill: Capillary refill takes less than 2 seconds.  Neurological:     General: No focal deficit present.     Mental Status: She is alert.     Sensory: No  sensory deficit.     Motor: No weakness.     Comments: Motor function and sensation intact in right upper extremity  Psychiatric:        Mood and Affect: Mood normal.     ED Results / Procedures / Treatments   Labs (all labs ordered are listed, but only abnormal results are displayed) Labs Reviewed - No data to display  EKG None  Radiology DG Wrist Complete Right  Result Date: 09/16/2019 CLINICAL DATA:  fall, wrist deformity/pain. Pt stated that she was stepping up onto the sidewalk and she tripped putting her hands out in front of her and slammed her right hand into a tree. When pt first arrived she said her fingers were starting to feel cold. EXAM: RIGHT WRIST - COMPLETE 3+ VIEW COMPARISON:  None. FINDINGS: There is a comminuted, impacted and dorsally displaced fracture of the distal radius. Primary fracture is transverse across the metaphysis with secondary fractures that extend to the articular surface. The impaction is predominantly dorsal which leads to dorsal angulation of the distal articular surface of 38 degrees. There is associated fracture of the ulnar styloid, minimally displaced in a radial direction. No dislocation. There is surrounding soft tissue swelling. IMPRESSION: 1. Comminuted, dorsally impacted and angulated intra-articular fracture of the distal radius with an associated ulnar styloid fracture. No dislocation. Electronically Signed   By: Lajean Manes M.D.   On: 09/16/2019 15:15    Procedures .Ortho Injury Treatment  Date/Time: 09/16/2019 7:17 PM Performed by: Lennice Sites, DO Authorized by: Lennice Sites, DO   Consent:    Consent obtained:  Written   Consent given by:  Patient   Risks discussed:  Irreducible dislocation, fracture, nerve damage, recurrent dislocation, restricted joint movement, stiffness and vascular damage   Alternatives discussed:  Delayed treatmentInjury location: wrist Location details: right wrist Injury type: fracture Fracture  type: distal radius Pre-procedure neurovascular assessment: neurovascularly intact Pre-procedure distal perfusion: normal Pre-procedure neurological function: normal Pre-procedure range of motion: normal Anesthesia: hematoma block  Anesthesia: Local anesthesia used: yes Local Anesthetic: lidocaine 1% without epinephrine Anesthetic total: 5 mL  Patient sedated: NoManipulation performed: yes Skin traction used: yes Skeletal traction used: yes Reduction successful: yes X-ray confirmed reduction: yes Immobilization: splint Splint type: sugar tong Supplies used: plaster Post-procedure neurovascular assessment: post-procedure neurovascularly intact Post-procedure distal perfusion: normal Post-procedure neurological function: normal Post-procedure range of motion: normal Patient tolerance: patient tolerated the procedure well with no immediate complications  Comments: Slight improvement on post reduction film    (including critical care time)  Medications Ordered in ED Medications  lidocaine (PF) (XYLOCAINE) 1 % injection 5 mL (5 mLs Infiltration Given 09/16/19 1538)  oxyCODONE (Oxy IR/ROXICODONE) immediate release tablet 5 mg (5 mg Oral Given 09/16/19 1527)    ED Course  I have reviewed the triage vital signs and the nursing notes.  Pertinent labs & imaging results that were available during my care of the patient were reviewed by me and considered in my medical decision making (see chart for details).    MDM Rules/Calculators/A&P  Betsey Holiday Delbuono    Final Clinical Impression(s) / ED Diagnoses Final diagnoses:  None    Rx / DC Orders ED Discharge Orders    None       Lennice Sites, DO 09/16/19 2358

## 2019-09-16 NOTE — ED Triage Notes (Signed)
Pt here via [facility], tripped and fell while trying to step up onto the sidewalk . Hit head on tree, hematoma and abrasions noted to R side of head. Did not pass out, no dizziness prior to fall. Deformity to R wrist. No on blood thinners.

## 2019-09-16 NOTE — ED Notes (Signed)
Patient verbalizes understanding of discharge instructions. Opportunity for questioning and answers were provided. Armband removed by staff, pt discharged from ED with son to pick up with assistance from tech into vehicle

## 2019-09-18 DIAGNOSIS — S62101A Fracture of unspecified carpal bone, right wrist, initial encounter for closed fracture: Secondary | ICD-10-CM | POA: Diagnosis not present

## 2019-09-25 DIAGNOSIS — S52531A Colles' fracture of right radius, initial encounter for closed fracture: Secondary | ICD-10-CM | POA: Diagnosis not present

## 2019-09-27 DIAGNOSIS — M25531 Pain in right wrist: Secondary | ICD-10-CM | POA: Diagnosis not present

## 2019-09-27 DIAGNOSIS — Y929 Unspecified place or not applicable: Secondary | ICD-10-CM | POA: Diagnosis not present

## 2019-09-27 DIAGNOSIS — Y999 Unspecified external cause status: Secondary | ICD-10-CM | POA: Diagnosis not present

## 2019-09-27 DIAGNOSIS — S52572A Other intraarticular fracture of lower end of left radius, initial encounter for closed fracture: Secondary | ICD-10-CM | POA: Diagnosis not present

## 2019-09-27 DIAGNOSIS — S52531A Colles' fracture of right radius, initial encounter for closed fracture: Secondary | ICD-10-CM | POA: Diagnosis not present

## 2019-09-27 DIAGNOSIS — X58XXXA Exposure to other specified factors, initial encounter: Secondary | ICD-10-CM | POA: Diagnosis not present

## 2019-09-27 DIAGNOSIS — Y939 Activity, unspecified: Secondary | ICD-10-CM | POA: Diagnosis not present

## 2019-10-09 DIAGNOSIS — S52531D Colles' fracture of right radius, subsequent encounter for closed fracture with routine healing: Secondary | ICD-10-CM | POA: Diagnosis not present

## 2019-10-16 DIAGNOSIS — R42 Dizziness and giddiness: Secondary | ICD-10-CM | POA: Diagnosis not present

## 2019-10-16 DIAGNOSIS — Z6828 Body mass index (BMI) 28.0-28.9, adult: Secondary | ICD-10-CM | POA: Diagnosis not present

## 2019-10-16 DIAGNOSIS — S62101A Fracture of unspecified carpal bone, right wrist, initial encounter for closed fracture: Secondary | ICD-10-CM | POA: Diagnosis not present

## 2019-10-16 DIAGNOSIS — G4700 Insomnia, unspecified: Secondary | ICD-10-CM | POA: Diagnosis not present

## 2019-10-16 DIAGNOSIS — R69 Illness, unspecified: Secondary | ICD-10-CM | POA: Diagnosis not present

## 2019-10-16 DIAGNOSIS — J309 Allergic rhinitis, unspecified: Secondary | ICD-10-CM | POA: Diagnosis not present

## 2019-10-16 DIAGNOSIS — E663 Overweight: Secondary | ICD-10-CM | POA: Diagnosis not present

## 2019-10-16 DIAGNOSIS — I10 Essential (primary) hypertension: Secondary | ICD-10-CM | POA: Diagnosis not present

## 2019-10-16 DIAGNOSIS — R296 Repeated falls: Secondary | ICD-10-CM | POA: Diagnosis not present

## 2019-10-18 DIAGNOSIS — I10 Essential (primary) hypertension: Secondary | ICD-10-CM | POA: Diagnosis not present

## 2019-10-18 DIAGNOSIS — R296 Repeated falls: Secondary | ICD-10-CM | POA: Diagnosis not present

## 2019-10-18 DIAGNOSIS — M069 Rheumatoid arthritis, unspecified: Secondary | ICD-10-CM | POA: Diagnosis not present

## 2019-10-18 DIAGNOSIS — M159 Polyosteoarthritis, unspecified: Secondary | ICD-10-CM | POA: Diagnosis not present

## 2019-10-18 DIAGNOSIS — Z9181 History of falling: Secondary | ICD-10-CM | POA: Diagnosis not present

## 2019-10-18 DIAGNOSIS — H8110 Benign paroxysmal vertigo, unspecified ear: Secondary | ICD-10-CM | POA: Diagnosis not present

## 2019-10-18 DIAGNOSIS — G4700 Insomnia, unspecified: Secondary | ICD-10-CM | POA: Diagnosis not present

## 2019-10-18 DIAGNOSIS — M80031D Age-related osteoporosis with current pathological fracture, right forearm, subsequent encounter for fracture with routine healing: Secondary | ICD-10-CM | POA: Diagnosis not present

## 2019-10-18 DIAGNOSIS — J309 Allergic rhinitis, unspecified: Secondary | ICD-10-CM | POA: Diagnosis not present

## 2019-10-18 DIAGNOSIS — R69 Illness, unspecified: Secondary | ICD-10-CM | POA: Diagnosis not present

## 2019-11-08 DIAGNOSIS — R296 Repeated falls: Secondary | ICD-10-CM | POA: Diagnosis not present

## 2019-11-08 DIAGNOSIS — M159 Polyosteoarthritis, unspecified: Secondary | ICD-10-CM | POA: Diagnosis not present

## 2019-11-08 DIAGNOSIS — H8110 Benign paroxysmal vertigo, unspecified ear: Secondary | ICD-10-CM | POA: Diagnosis not present

## 2019-11-08 DIAGNOSIS — R69 Illness, unspecified: Secondary | ICD-10-CM | POA: Diagnosis not present

## 2019-11-08 DIAGNOSIS — J309 Allergic rhinitis, unspecified: Secondary | ICD-10-CM | POA: Diagnosis not present

## 2019-11-08 DIAGNOSIS — Z9181 History of falling: Secondary | ICD-10-CM | POA: Diagnosis not present

## 2019-11-08 DIAGNOSIS — M069 Rheumatoid arthritis, unspecified: Secondary | ICD-10-CM | POA: Diagnosis not present

## 2019-11-08 DIAGNOSIS — G4700 Insomnia, unspecified: Secondary | ICD-10-CM | POA: Diagnosis not present

## 2019-11-08 DIAGNOSIS — M80031D Age-related osteoporosis with current pathological fracture, right forearm, subsequent encounter for fracture with routine healing: Secondary | ICD-10-CM | POA: Diagnosis not present

## 2019-11-08 DIAGNOSIS — I10 Essential (primary) hypertension: Secondary | ICD-10-CM | POA: Diagnosis not present

## 2019-11-21 DIAGNOSIS — H7291 Unspecified perforation of tympanic membrane, right ear: Secondary | ICD-10-CM | POA: Diagnosis not present

## 2019-11-21 DIAGNOSIS — E039 Hypothyroidism, unspecified: Secondary | ICD-10-CM | POA: Diagnosis not present

## 2019-11-21 DIAGNOSIS — H905 Unspecified sensorineural hearing loss: Secondary | ICD-10-CM | POA: Diagnosis not present

## 2019-11-21 DIAGNOSIS — R42 Dizziness and giddiness: Secondary | ICD-10-CM | POA: Diagnosis not present

## 2019-11-21 DIAGNOSIS — R269 Unspecified abnormalities of gait and mobility: Secondary | ICD-10-CM | POA: Diagnosis not present

## 2019-11-21 DIAGNOSIS — H9071 Mixed conductive and sensorineural hearing loss, unilateral, right ear, with unrestricted hearing on the contralateral side: Secondary | ICD-10-CM | POA: Diagnosis not present

## 2019-12-04 DIAGNOSIS — Z2821 Immunization not carried out because of patient refusal: Secondary | ICD-10-CM | POA: Diagnosis not present

## 2019-12-04 DIAGNOSIS — S52531D Colles' fracture of right radius, subsequent encounter for closed fracture with routine healing: Secondary | ICD-10-CM | POA: Diagnosis not present

## 2019-12-04 DIAGNOSIS — E039 Hypothyroidism, unspecified: Secondary | ICD-10-CM | POA: Diagnosis not present

## 2019-12-04 DIAGNOSIS — Z6828 Body mass index (BMI) 28.0-28.9, adult: Secondary | ICD-10-CM | POA: Diagnosis not present

## 2019-12-04 DIAGNOSIS — E785 Hyperlipidemia, unspecified: Secondary | ICD-10-CM | POA: Diagnosis not present

## 2019-12-04 DIAGNOSIS — Z79899 Other long term (current) drug therapy: Secondary | ICD-10-CM | POA: Diagnosis not present

## 2019-12-04 DIAGNOSIS — E663 Overweight: Secondary | ICD-10-CM | POA: Diagnosis not present

## 2019-12-04 DIAGNOSIS — E559 Vitamin D deficiency, unspecified: Secondary | ICD-10-CM | POA: Diagnosis not present

## 2019-12-04 DIAGNOSIS — R42 Dizziness and giddiness: Secondary | ICD-10-CM | POA: Diagnosis not present

## 2019-12-05 DIAGNOSIS — E039 Hypothyroidism, unspecified: Secondary | ICD-10-CM | POA: Diagnosis not present

## 2019-12-05 DIAGNOSIS — E785 Hyperlipidemia, unspecified: Secondary | ICD-10-CM | POA: Diagnosis not present

## 2019-12-05 DIAGNOSIS — Z79899 Other long term (current) drug therapy: Secondary | ICD-10-CM | POA: Diagnosis not present

## 2019-12-05 DIAGNOSIS — E559 Vitamin D deficiency, unspecified: Secondary | ICD-10-CM | POA: Diagnosis not present

## 2020-01-13 DIAGNOSIS — R42 Dizziness and giddiness: Secondary | ICD-10-CM | POA: Diagnosis not present

## 2020-01-13 DIAGNOSIS — Z6828 Body mass index (BMI) 28.0-28.9, adult: Secondary | ICD-10-CM | POA: Diagnosis not present

## 2020-01-13 DIAGNOSIS — R69 Illness, unspecified: Secondary | ICD-10-CM | POA: Diagnosis not present

## 2020-01-13 DIAGNOSIS — G4700 Insomnia, unspecified: Secondary | ICD-10-CM | POA: Diagnosis not present

## 2020-01-13 DIAGNOSIS — E785 Hyperlipidemia, unspecified: Secondary | ICD-10-CM | POA: Diagnosis not present

## 2020-01-13 DIAGNOSIS — E559 Vitamin D deficiency, unspecified: Secondary | ICD-10-CM | POA: Diagnosis not present

## 2020-01-13 DIAGNOSIS — J309 Allergic rhinitis, unspecified: Secondary | ICD-10-CM | POA: Diagnosis not present

## 2020-01-13 DIAGNOSIS — R269 Unspecified abnormalities of gait and mobility: Secondary | ICD-10-CM | POA: Diagnosis not present

## 2020-01-13 DIAGNOSIS — I10 Essential (primary) hypertension: Secondary | ICD-10-CM | POA: Diagnosis not present

## 2020-01-13 DIAGNOSIS — E663 Overweight: Secondary | ICD-10-CM | POA: Diagnosis not present

## 2020-01-20 DIAGNOSIS — E785 Hyperlipidemia, unspecified: Secondary | ICD-10-CM | POA: Insufficient documentation

## 2020-01-20 DIAGNOSIS — E039 Hypothyroidism, unspecified: Secondary | ICD-10-CM

## 2020-01-20 DIAGNOSIS — R269 Unspecified abnormalities of gait and mobility: Secondary | ICD-10-CM | POA: Diagnosis not present

## 2020-01-20 DIAGNOSIS — K219 Gastro-esophageal reflux disease without esophagitis: Secondary | ICD-10-CM

## 2020-01-20 DIAGNOSIS — F419 Anxiety disorder, unspecified: Secondary | ICD-10-CM | POA: Insufficient documentation

## 2020-01-20 DIAGNOSIS — M069 Rheumatoid arthritis, unspecified: Secondary | ICD-10-CM | POA: Insufficient documentation

## 2020-01-20 DIAGNOSIS — R42 Dizziness and giddiness: Secondary | ICD-10-CM | POA: Diagnosis not present

## 2020-01-20 HISTORY — DX: Hypothyroidism, unspecified: E03.9

## 2020-01-20 HISTORY — DX: Gastro-esophageal reflux disease without esophagitis: K21.9

## 2020-01-20 HISTORY — DX: Anxiety disorder, unspecified: F41.9

## 2020-01-20 HISTORY — DX: Hyperlipidemia, unspecified: E78.5

## 2020-01-20 HISTORY — DX: Rheumatoid arthritis, unspecified: M06.9

## 2020-02-04 DIAGNOSIS — S0990XA Unspecified injury of head, initial encounter: Secondary | ICD-10-CM | POA: Diagnosis not present

## 2020-02-04 DIAGNOSIS — R269 Unspecified abnormalities of gait and mobility: Secondary | ICD-10-CM | POA: Diagnosis not present

## 2020-02-04 DIAGNOSIS — R42 Dizziness and giddiness: Secondary | ICD-10-CM | POA: Diagnosis not present

## 2020-02-18 DIAGNOSIS — I10 Essential (primary) hypertension: Secondary | ICD-10-CM | POA: Diagnosis not present

## 2020-02-18 DIAGNOSIS — E663 Overweight: Secondary | ICD-10-CM | POA: Diagnosis not present

## 2020-02-18 DIAGNOSIS — J309 Allergic rhinitis, unspecified: Secondary | ICD-10-CM | POA: Diagnosis not present

## 2020-02-18 DIAGNOSIS — R69 Illness, unspecified: Secondary | ICD-10-CM | POA: Diagnosis not present

## 2020-02-18 DIAGNOSIS — R269 Unspecified abnormalities of gait and mobility: Secondary | ICD-10-CM | POA: Diagnosis not present

## 2020-02-18 DIAGNOSIS — E785 Hyperlipidemia, unspecified: Secondary | ICD-10-CM | POA: Diagnosis not present

## 2020-02-18 DIAGNOSIS — Z6828 Body mass index (BMI) 28.0-28.9, adult: Secondary | ICD-10-CM | POA: Diagnosis not present

## 2020-02-18 DIAGNOSIS — E559 Vitamin D deficiency, unspecified: Secondary | ICD-10-CM | POA: Diagnosis not present

## 2020-02-18 DIAGNOSIS — R42 Dizziness and giddiness: Secondary | ICD-10-CM | POA: Diagnosis not present

## 2020-02-18 DIAGNOSIS — G4700 Insomnia, unspecified: Secondary | ICD-10-CM | POA: Diagnosis not present

## 2020-03-10 DIAGNOSIS — R42 Dizziness and giddiness: Secondary | ICD-10-CM | POA: Diagnosis not present

## 2020-04-08 DIAGNOSIS — Z9181 History of falling: Secondary | ICD-10-CM | POA: Diagnosis not present

## 2020-04-08 DIAGNOSIS — Z0000 Encounter for general adult medical examination without abnormal findings: Secondary | ICD-10-CM | POA: Diagnosis not present

## 2020-04-08 DIAGNOSIS — Z139 Encounter for screening, unspecified: Secondary | ICD-10-CM | POA: Diagnosis not present

## 2020-04-08 DIAGNOSIS — Z1331 Encounter for screening for depression: Secondary | ICD-10-CM | POA: Diagnosis not present

## 2020-04-08 DIAGNOSIS — E785 Hyperlipidemia, unspecified: Secondary | ICD-10-CM | POA: Diagnosis not present

## 2020-05-11 DIAGNOSIS — H9192 Unspecified hearing loss, left ear: Secondary | ICD-10-CM | POA: Diagnosis not present

## 2020-05-11 DIAGNOSIS — J029 Acute pharyngitis, unspecified: Secondary | ICD-10-CM | POA: Diagnosis not present

## 2020-05-11 DIAGNOSIS — G4700 Insomnia, unspecified: Secondary | ICD-10-CM | POA: Diagnosis not present

## 2020-05-11 DIAGNOSIS — R42 Dizziness and giddiness: Secondary | ICD-10-CM | POA: Diagnosis not present

## 2020-06-30 DIAGNOSIS — E663 Overweight: Secondary | ICD-10-CM | POA: Diagnosis not present

## 2020-06-30 DIAGNOSIS — Z23 Encounter for immunization: Secondary | ICD-10-CM | POA: Diagnosis not present

## 2020-06-30 DIAGNOSIS — M069 Rheumatoid arthritis, unspecified: Secondary | ICD-10-CM | POA: Diagnosis not present

## 2020-06-30 DIAGNOSIS — Z79899 Other long term (current) drug therapy: Secondary | ICD-10-CM | POA: Diagnosis not present

## 2020-06-30 DIAGNOSIS — E039 Hypothyroidism, unspecified: Secondary | ICD-10-CM | POA: Diagnosis not present

## 2020-06-30 DIAGNOSIS — E785 Hyperlipidemia, unspecified: Secondary | ICD-10-CM | POA: Diagnosis not present

## 2020-06-30 DIAGNOSIS — I10 Essential (primary) hypertension: Secondary | ICD-10-CM | POA: Diagnosis not present

## 2020-06-30 DIAGNOSIS — E559 Vitamin D deficiency, unspecified: Secondary | ICD-10-CM | POA: Diagnosis not present

## 2020-06-30 DIAGNOSIS — H8190 Unspecified disorder of vestibular function, unspecified ear: Secondary | ICD-10-CM | POA: Diagnosis not present

## 2020-06-30 DIAGNOSIS — Z6828 Body mass index (BMI) 28.0-28.9, adult: Secondary | ICD-10-CM | POA: Diagnosis not present

## 2020-07-02 DIAGNOSIS — H5213 Myopia, bilateral: Secondary | ICD-10-CM | POA: Diagnosis not present

## 2020-07-02 DIAGNOSIS — H524 Presbyopia: Secondary | ICD-10-CM | POA: Diagnosis not present

## 2020-08-17 DIAGNOSIS — J029 Acute pharyngitis, unspecified: Secondary | ICD-10-CM | POA: Diagnosis not present

## 2020-08-17 DIAGNOSIS — J309 Allergic rhinitis, unspecified: Secondary | ICD-10-CM | POA: Diagnosis not present

## 2020-08-17 DIAGNOSIS — J209 Acute bronchitis, unspecified: Secondary | ICD-10-CM | POA: Diagnosis not present

## 2020-08-17 DIAGNOSIS — J019 Acute sinusitis, unspecified: Secondary | ICD-10-CM | POA: Diagnosis not present

## 2020-10-13 DIAGNOSIS — Z6828 Body mass index (BMI) 28.0-28.9, adult: Secondary | ICD-10-CM | POA: Diagnosis not present

## 2020-10-13 DIAGNOSIS — I10 Essential (primary) hypertension: Secondary | ICD-10-CM | POA: Diagnosis not present

## 2020-10-13 DIAGNOSIS — H8190 Unspecified disorder of vestibular function, unspecified ear: Secondary | ICD-10-CM | POA: Diagnosis not present

## 2020-10-13 DIAGNOSIS — E559 Vitamin D deficiency, unspecified: Secondary | ICD-10-CM | POA: Diagnosis not present

## 2020-10-13 DIAGNOSIS — Z79899 Other long term (current) drug therapy: Secondary | ICD-10-CM | POA: Diagnosis not present

## 2020-10-13 DIAGNOSIS — R42 Dizziness and giddiness: Secondary | ICD-10-CM | POA: Diagnosis not present

## 2020-10-13 DIAGNOSIS — R269 Unspecified abnormalities of gait and mobility: Secondary | ICD-10-CM | POA: Diagnosis not present

## 2020-10-13 DIAGNOSIS — E785 Hyperlipidemia, unspecified: Secondary | ICD-10-CM | POA: Diagnosis not present

## 2020-10-13 DIAGNOSIS — E663 Overweight: Secondary | ICD-10-CM | POA: Diagnosis not present

## 2020-10-13 DIAGNOSIS — E039 Hypothyroidism, unspecified: Secondary | ICD-10-CM | POA: Diagnosis not present

## 2020-10-22 DIAGNOSIS — Z1231 Encounter for screening mammogram for malignant neoplasm of breast: Secondary | ICD-10-CM | POA: Diagnosis not present

## 2020-11-26 ENCOUNTER — Other Ambulatory Visit: Payer: Self-pay

## 2020-11-26 ENCOUNTER — Emergency Department (HOSPITAL_COMMUNITY)
Admission: EM | Admit: 2020-11-26 | Discharge: 2020-11-26 | Disposition: A | Discharge status: Home / Self Care | Payer: Medicare HMO | Attending: Emergency Medicine | Admitting: Emergency Medicine

## 2020-11-26 ENCOUNTER — Emergency Department (HOSPITAL_COMMUNITY): Payer: Medicare HMO

## 2020-11-26 DIAGNOSIS — R0789 Other chest pain: Secondary | ICD-10-CM | POA: Diagnosis not present

## 2020-11-26 DIAGNOSIS — R079 Chest pain, unspecified: Secondary | ICD-10-CM | POA: Insufficient documentation

## 2020-11-26 DIAGNOSIS — Z5321 Procedure and treatment not carried out due to patient leaving prior to being seen by health care provider: Secondary | ICD-10-CM | POA: Insufficient documentation

## 2020-11-26 DIAGNOSIS — Z743 Need for continuous supervision: Secondary | ICD-10-CM | POA: Diagnosis not present

## 2020-11-26 DIAGNOSIS — I491 Atrial premature depolarization: Secondary | ICD-10-CM | POA: Diagnosis not present

## 2020-11-26 DIAGNOSIS — R069 Unspecified abnormalities of breathing: Secondary | ICD-10-CM | POA: Diagnosis not present

## 2020-11-26 DIAGNOSIS — R1013 Epigastric pain: Secondary | ICD-10-CM | POA: Diagnosis not present

## 2020-11-26 LAB — BASIC METABOLIC PANEL
Anion gap: 9 (ref 5–15)
BUN: 9 mg/dL (ref 8–23)
CO2: 21 mmol/L — ABNORMAL LOW (ref 22–32)
Calcium: 9.1 mg/dL (ref 8.9–10.3)
Chloride: 109 mmol/L (ref 98–111)
Creatinine, Ser: 0.66 mg/dL (ref 0.44–1.00)
GFR, Estimated: >60 mL/min (ref >60)
Glucose, Bld: 100 mg/dL — ABNORMAL HIGH (ref 70–99)
Potassium: 3.7 mmol/L (ref 3.5–5.1)
Sodium: 139 mmol/L (ref 135–145)

## 2020-11-26 LAB — TROPONIN I (HIGH SENSITIVITY)
Troponin I (High Sensitivity): 5 ng/L (ref <18)
Troponin I (High Sensitivity): 6 ng/L (ref <18)

## 2020-11-26 LAB — CBC
HCT: 40.2 % (ref 36.0–46.0)
Hemoglobin: 13.5 g/dL (ref 12.0–15.0)
MCH: 29.4 pg (ref 26.0–34.0)
MCHC: 33.6 g/dL (ref 30.0–36.0)
MCV: 87.6 fL (ref 80.0–100.0)
Platelets: 296 10*3/uL (ref 150–400)
RBC: 4.59 MIL/uL (ref 3.87–5.11)
RDW: 13.3 % (ref 11.5–15.5)
WBC: 10.5 10*3/uL (ref 4.0–10.5)
nRBC: 0 % (ref 0.0–0.2)

## 2020-11-26 NOTE — ED Triage Notes (Signed)
Pt here with reports of chest pain onset this morning. Initial BP 847 systolic. Pain is mid/epigastric radiating to L arm. 250cc NS given en route along with 324mg  asa.

## 2020-11-26 NOTE — ED Notes (Signed)
Pt left with daughter due to wait time

## 2020-12-11 IMAGING — CT CT CERVICAL SPINE W/O CM
3 of 4 series · 12 of 33 positions shown, 14 images · non-contrast
Comparison: None.

CLINICAL DATA: Status post fall. Hit head on a tree with hematoma
abrasions of the right head.

EXAM:
CT HEAD WITHOUT CONTRAST
CT MAXILLOFACIAL WITHOUT CONTRAST
CT CERVICAL SPINE WITHOUT CONTRAST
TECHNIQUE: Multidetector CT imaging of the head, cervical spine, and
maxillofacial structures were performed using the standard protocol
without intravenous contrast. Multiplanar CT image reconstructions
of the cervical spine and maxillofacial structures were also
generated.

[Series 7: sag bone · sagittal · 0.39mm/px · 5 of 76 slices shown, 6 images]
[im 26/76  bone]
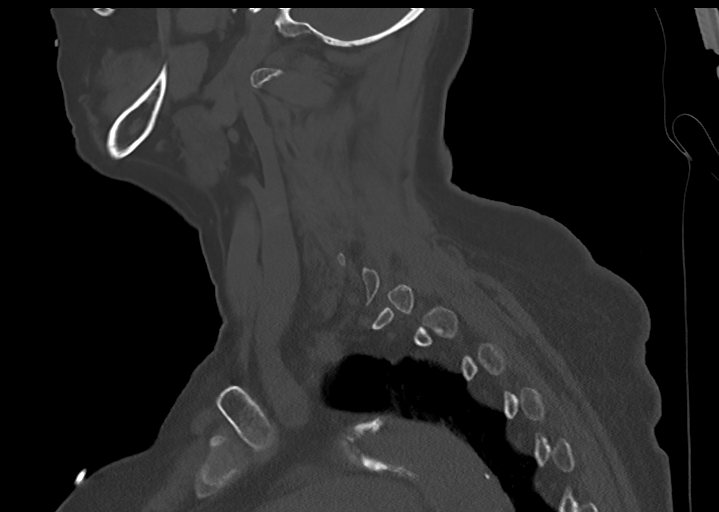
[im 32/76  bone]
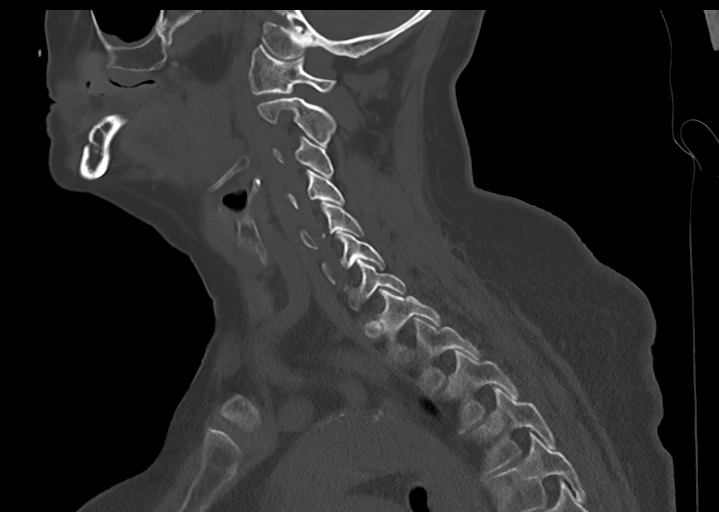
[im 38/76  soft-tissue]
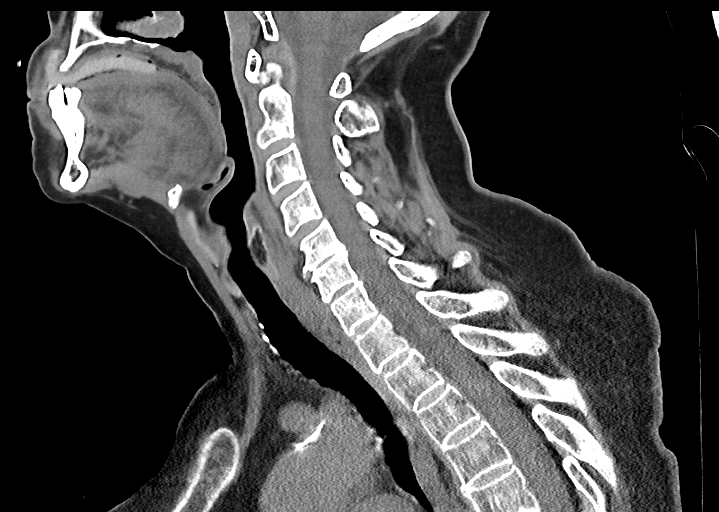
[im 38/76  bone]
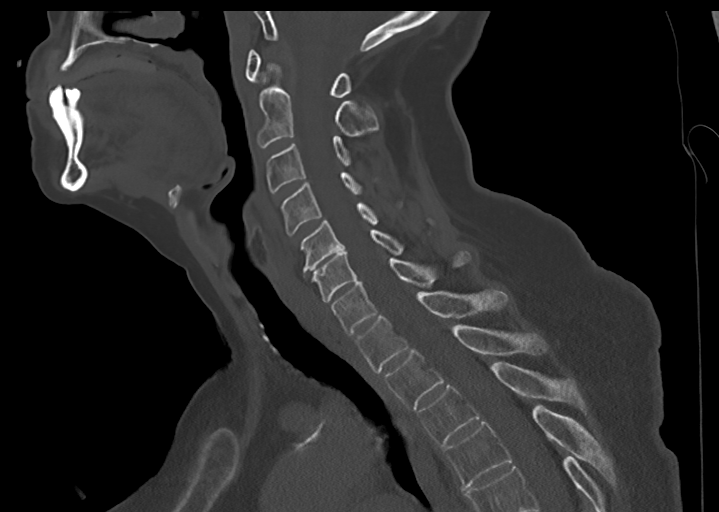
[im 44/76  bone]
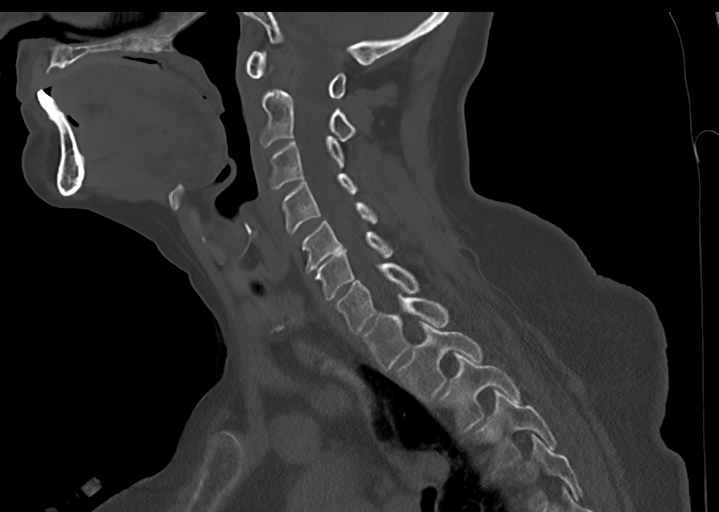
[im 51/76  bone]
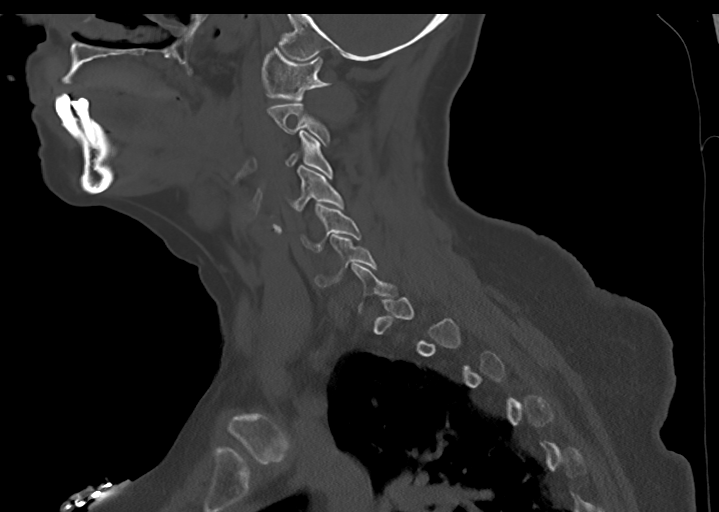

[Series 8: cor bone · coronal · 0.28mm/px · 3 of 72 slices shown]
[im 15/72  bone]
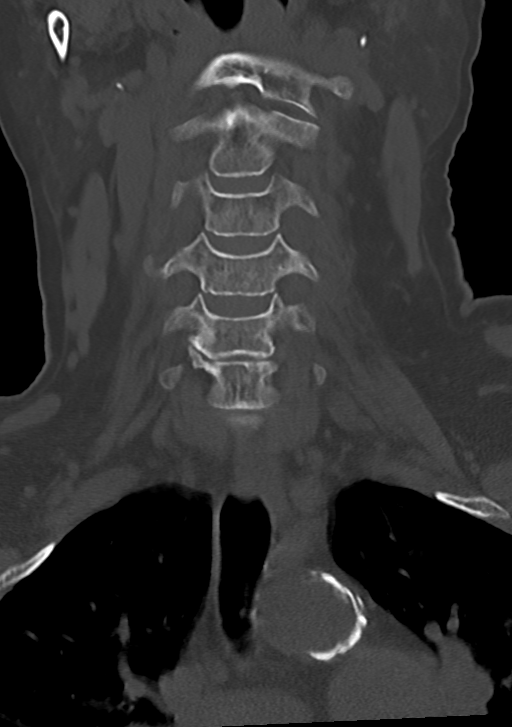
[im 29/72  bone]
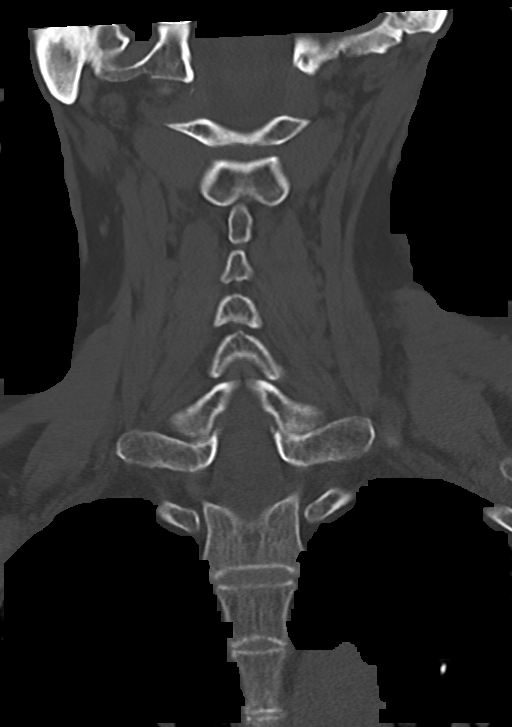
[im 43/72  bone]
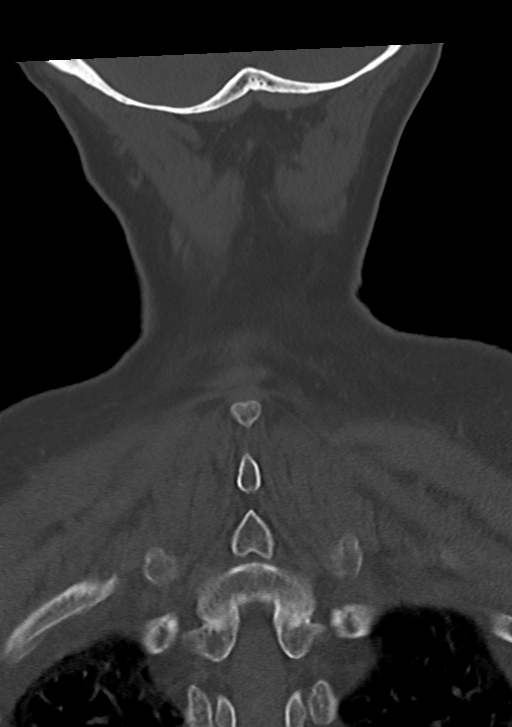

[Series 10: orthogonal axials · axial · 0.21mm/px · z∈[-355,-218]mm · 4 of 117 slices shown, 5 images]
[im 17/117  soft-tissue]
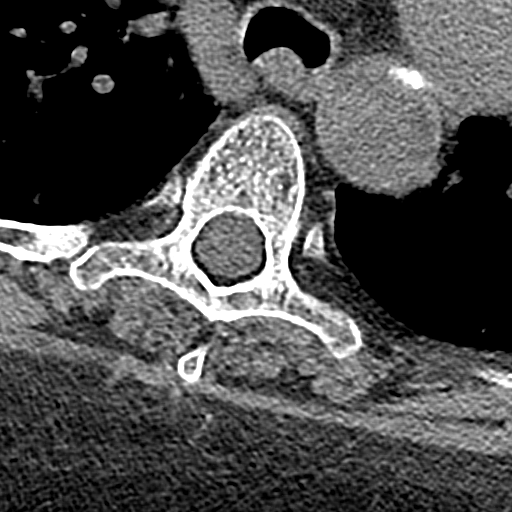
[im 17/117  bone]
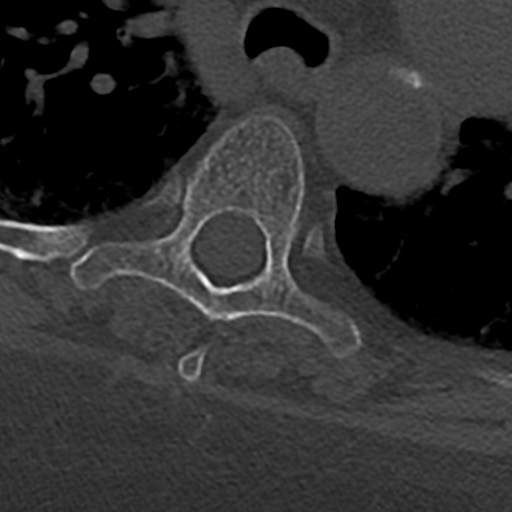
[im 50/117  bone]
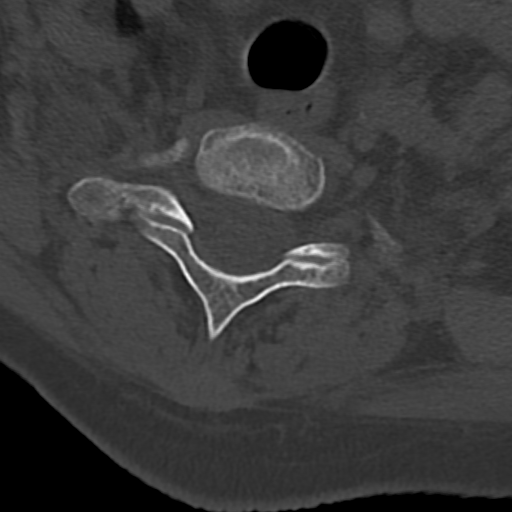
[im 67/117  bone]
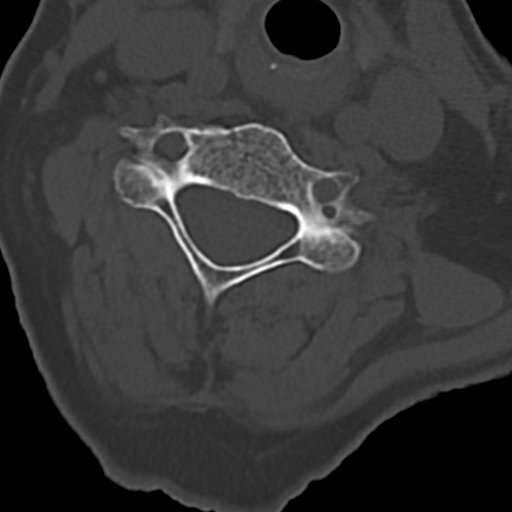
[im 100/117  bone]
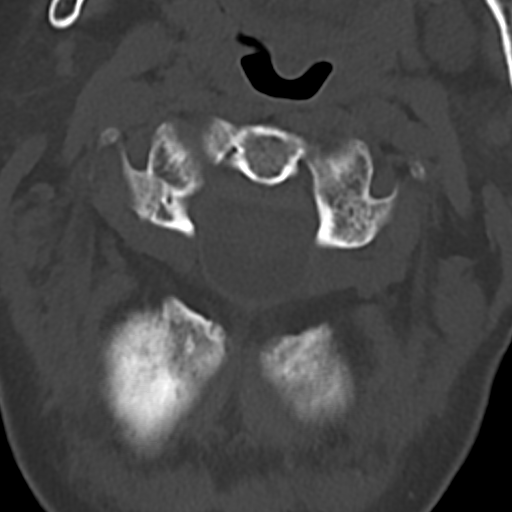

[12 of 33 positions shown; findings below may reference images not displayed]

FINDINGS: CT HEAD FINDINGS

Brain: No evidence of acute infarction, hemorrhage, extra-axial
collection, ventriculomegaly, or mass effect. Generalized cerebral
atrophy. Periventricular white matter low attenuation likely
secondary to microangiopathy.

Vascular: Cerebrovascular atherosclerotic calcifications are noted.

Skull: Negative for fracture or focal lesion.

Sinuses/Orbits: Visualized portions of the orbits are unremarkable.
Small left mastoid effusion.

Other: Right frontotemporal scalp hematoma.

CT MAXILLOFACIAL FINDINGS

Osseous: No fracture or mandibular dislocation. No destructive
process.

Orbits: Negative. No traumatic or inflammatory finding.

Sinuses: Mild maxillary sinus mucosal thickening bilaterally. No
paranasal sinus air-fluid level. Small left mastoid effusion.

Soft tissues: Right frontotemporal scalp hematoma.

CT CERVICAL SPINE FINDINGS

Alignment: Normal.

Skull base and vertebrae: No acute fracture. No primary bone lesion
or focal pathologic process.

Soft tissues and spinal canal: No prevertebral fluid or swelling. No
visible canal hematoma.

Disc levels: Degenerative disease with disc height loss at C5-6.
Bilateral uncovertebral degenerative changes at C5-6 with foraminal
encroachment, right greater than left.

Upper chest: 11 mm spiculated right upper lobe pulmonary nodule.

Other: Bilateral carotid artery atherosclerosis.
IMPRESSION: 1. No acute intracranial pathology.
2.  No acute osseous injury of the maxillofacial bones.
3.  No acute osseous injury of the cervical spine.
4. 11 mm spiculated right upper lobe pulmonary nodule. Recommend
further evaluation with a nonemergent CT of the chest.
5.  Aortic Atherosclerosis ([id number]).

## 2020-12-11 IMAGING — DX DG WRIST COMPLETE 3+V*R*
4 series · 4 of 4 positions shown · non-contrast
Comparison: None.

CLINICAL DATA: fall, wrist deformity/pain. Pt stated that she was
stepping up onto the sidewalk and she tripped putting her hands out
in front of her and slammed her right hand into a tree. When pt
first arrived she said her fingers were starting to feel cold.

EXAM:
RIGHT WRIST - COMPLETE 3+ VIEW

[wrist pa]
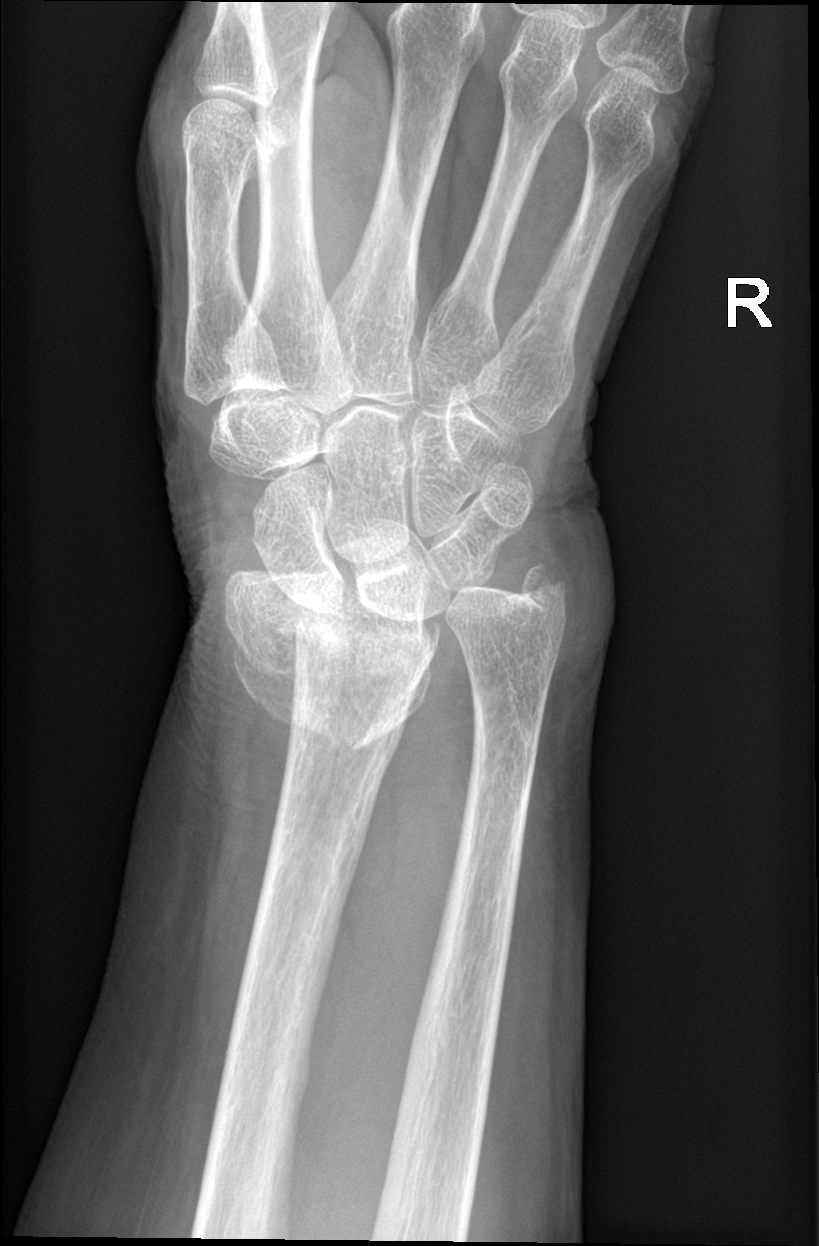

[wrist obl]
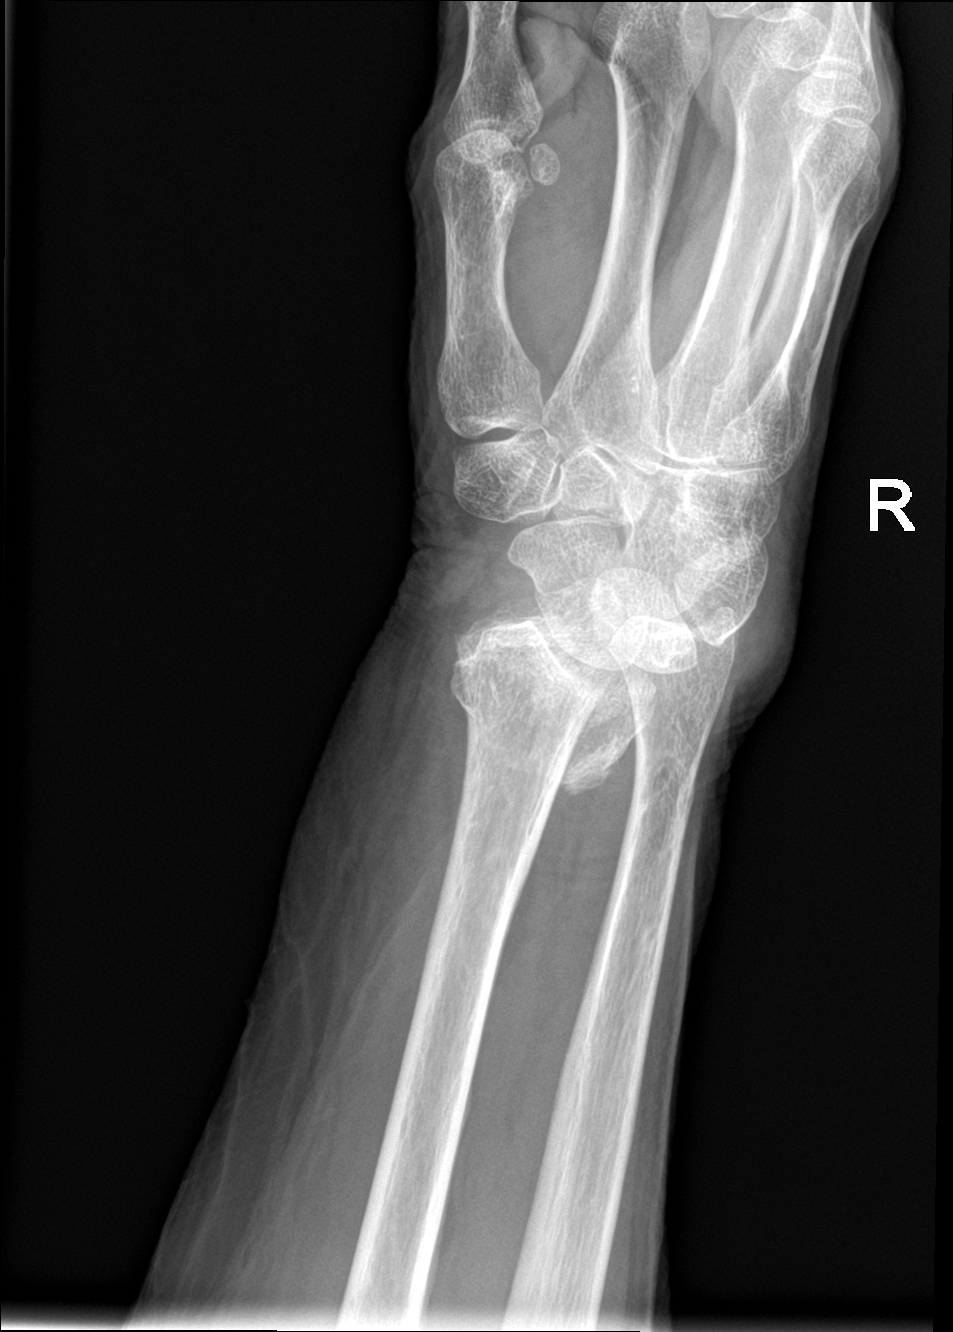

[wrist lat]
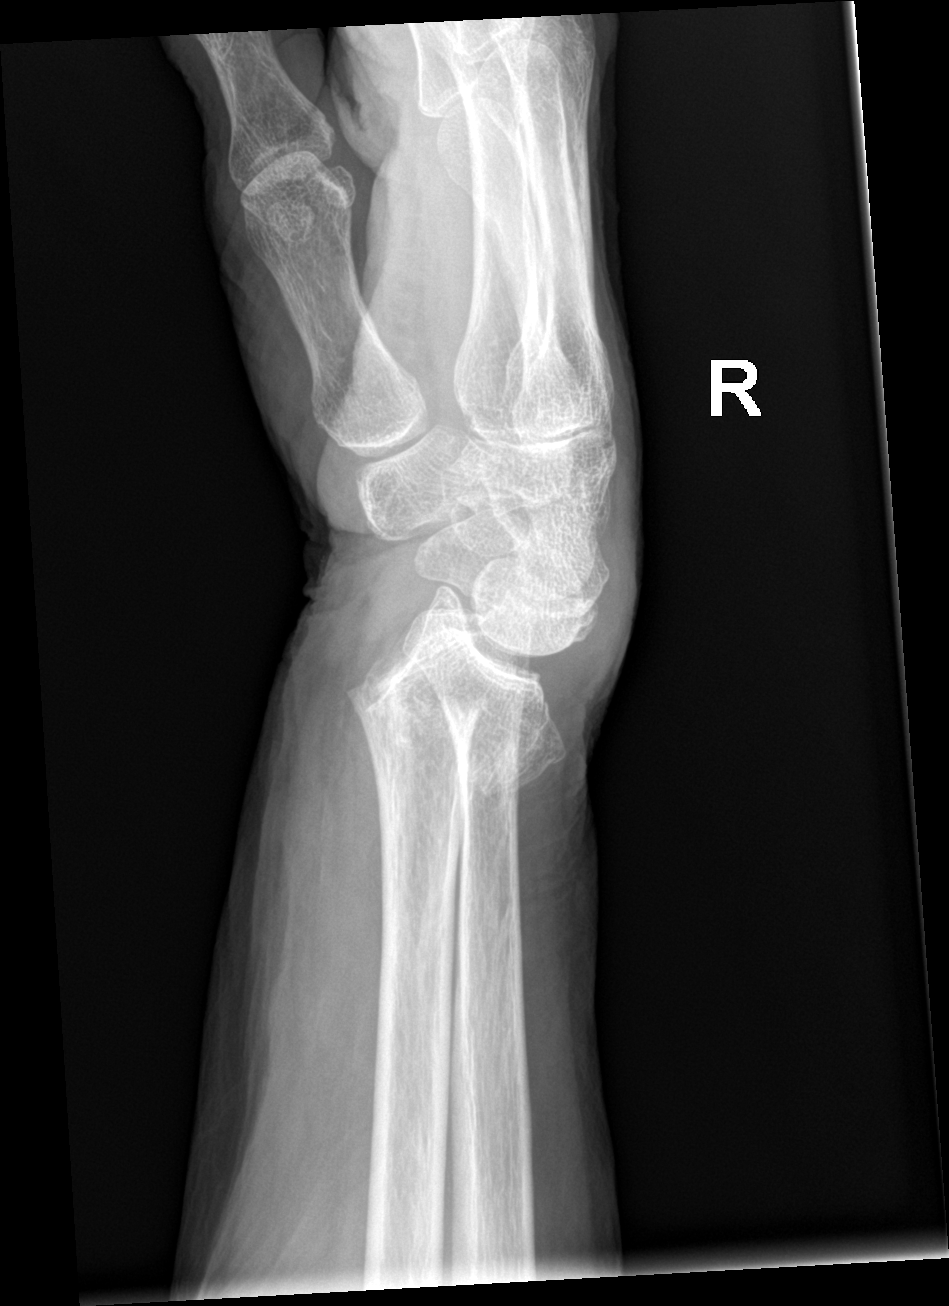

[wrist navicular]
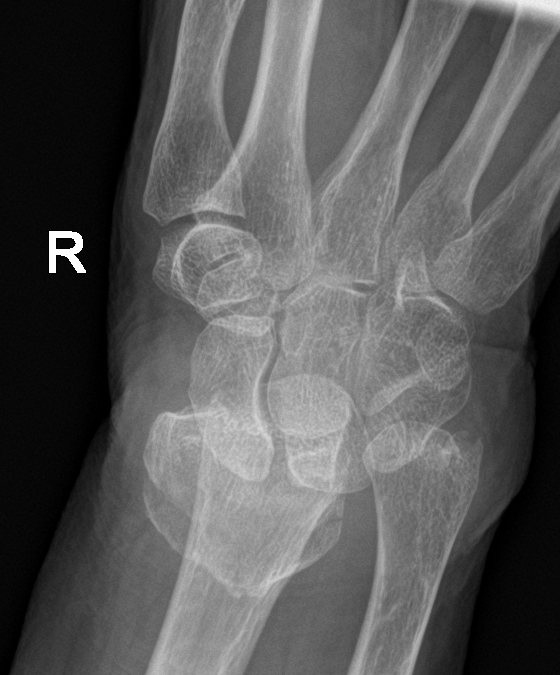

[4 of 4 positions shown; findings below may reference images not displayed]

FINDINGS: There is a comminuted, impacted and dorsally displaced fracture of
the distal radius. Primary fracture is transverse across the
metaphysis with secondary fractures that extend to the articular
surface. The impaction is predominantly dorsal which leads to dorsal
angulation of the distal articular surface of 38 degrees.

There is associated fracture of the ulnar styloid, minimally
displaced in a radial direction.

No dislocation.

There is surrounding soft tissue swelling.
IMPRESSION: 1. Comminuted, dorsally impacted and angulated intra-articular
fracture of the distal radius with an associated ulnar styloid
fracture. No dislocation.

## 2020-12-16 DIAGNOSIS — Z6828 Body mass index (BMI) 28.0-28.9, adult: Secondary | ICD-10-CM | POA: Diagnosis not present

## 2020-12-16 DIAGNOSIS — G4700 Insomnia, unspecified: Secondary | ICD-10-CM | POA: Diagnosis not present

## 2020-12-16 DIAGNOSIS — E559 Vitamin D deficiency, unspecified: Secondary | ICD-10-CM | POA: Diagnosis not present

## 2020-12-16 DIAGNOSIS — R269 Unspecified abnormalities of gait and mobility: Secondary | ICD-10-CM | POA: Diagnosis not present

## 2020-12-16 DIAGNOSIS — E039 Hypothyroidism, unspecified: Secondary | ICD-10-CM | POA: Diagnosis not present

## 2020-12-16 DIAGNOSIS — E663 Overweight: Secondary | ICD-10-CM | POA: Diagnosis not present

## 2020-12-16 DIAGNOSIS — R69 Illness, unspecified: Secondary | ICD-10-CM | POA: Diagnosis not present

## 2021-01-06 DIAGNOSIS — G479 Sleep disorder, unspecified: Secondary | ICD-10-CM | POA: Diagnosis not present

## 2021-01-06 DIAGNOSIS — E039 Hypothyroidism, unspecified: Secondary | ICD-10-CM | POA: Diagnosis not present

## 2021-01-06 DIAGNOSIS — E785 Hyperlipidemia, unspecified: Secondary | ICD-10-CM | POA: Diagnosis not present

## 2021-01-06 DIAGNOSIS — Z7689 Persons encountering health services in other specified circumstances: Secondary | ICD-10-CM | POA: Diagnosis not present

## 2021-02-10 DIAGNOSIS — E785 Hyperlipidemia, unspecified: Secondary | ICD-10-CM | POA: Diagnosis not present

## 2021-02-10 DIAGNOSIS — E039 Hypothyroidism, unspecified: Secondary | ICD-10-CM | POA: Diagnosis not present

## 2021-02-10 DIAGNOSIS — E2839 Other primary ovarian failure: Secondary | ICD-10-CM | POA: Diagnosis not present

## 2021-02-10 DIAGNOSIS — Z23 Encounter for immunization: Secondary | ICD-10-CM | POA: Diagnosis not present

## 2021-02-10 DIAGNOSIS — R7303 Prediabetes: Secondary | ICD-10-CM | POA: Diagnosis not present

## 2021-02-23 DIAGNOSIS — R269 Unspecified abnormalities of gait and mobility: Secondary | ICD-10-CM | POA: Diagnosis not present

## 2021-02-23 DIAGNOSIS — R42 Dizziness and giddiness: Secondary | ICD-10-CM | POA: Diagnosis not present

## 2021-02-23 DIAGNOSIS — Z6827 Body mass index (BMI) 27.0-27.9, adult: Secondary | ICD-10-CM | POA: Diagnosis not present

## 2021-03-03 DIAGNOSIS — E039 Hypothyroidism, unspecified: Secondary | ICD-10-CM | POA: Diagnosis not present

## 2021-03-03 DIAGNOSIS — G4700 Insomnia, unspecified: Secondary | ICD-10-CM | POA: Diagnosis not present

## 2021-03-03 DIAGNOSIS — M810 Age-related osteoporosis without current pathological fracture: Secondary | ICD-10-CM | POA: Diagnosis not present

## 2021-03-03 DIAGNOSIS — G479 Sleep disorder, unspecified: Secondary | ICD-10-CM | POA: Diagnosis not present

## 2021-03-10 DIAGNOSIS — M810 Age-related osteoporosis without current pathological fracture: Secondary | ICD-10-CM | POA: Diagnosis not present

## 2021-03-10 DIAGNOSIS — E2839 Other primary ovarian failure: Secondary | ICD-10-CM | POA: Diagnosis not present

## 2021-03-16 DIAGNOSIS — R42 Dizziness and giddiness: Secondary | ICD-10-CM | POA: Diagnosis not present

## 2021-03-24 DIAGNOSIS — M810 Age-related osteoporosis without current pathological fracture: Secondary | ICD-10-CM | POA: Diagnosis not present

## 2021-04-22 DIAGNOSIS — H905 Unspecified sensorineural hearing loss: Secondary | ICD-10-CM | POA: Diagnosis not present

## 2021-04-22 DIAGNOSIS — H9071 Mixed conductive and sensorineural hearing loss, unilateral, right ear, with unrestricted hearing on the contralateral side: Secondary | ICD-10-CM | POA: Diagnosis not present

## 2021-04-22 DIAGNOSIS — H9319 Tinnitus, unspecified ear: Secondary | ICD-10-CM | POA: Diagnosis not present

## 2021-04-22 DIAGNOSIS — H7291 Unspecified perforation of tympanic membrane, right ear: Secondary | ICD-10-CM | POA: Diagnosis not present

## 2021-04-22 DIAGNOSIS — H9190 Unspecified hearing loss, unspecified ear: Secondary | ICD-10-CM | POA: Diagnosis not present

## 2021-05-05 DIAGNOSIS — M810 Age-related osteoporosis without current pathological fracture: Secondary | ICD-10-CM | POA: Diagnosis not present

## 2021-05-31 DIAGNOSIS — H52223 Regular astigmatism, bilateral: Secondary | ICD-10-CM | POA: Diagnosis not present

## 2021-05-31 DIAGNOSIS — H43813 Vitreous degeneration, bilateral: Secondary | ICD-10-CM | POA: Diagnosis not present

## 2021-05-31 DIAGNOSIS — H2513 Age-related nuclear cataract, bilateral: Secondary | ICD-10-CM | POA: Diagnosis not present

## 2021-05-31 DIAGNOSIS — H5213 Myopia, bilateral: Secondary | ICD-10-CM | POA: Diagnosis not present

## 2021-06-03 DIAGNOSIS — J309 Allergic rhinitis, unspecified: Secondary | ICD-10-CM | POA: Diagnosis not present

## 2021-06-03 DIAGNOSIS — B370 Candidal stomatitis: Secondary | ICD-10-CM | POA: Diagnosis not present

## 2021-06-03 DIAGNOSIS — Z20822 Contact with and (suspected) exposure to covid-19: Secondary | ICD-10-CM | POA: Diagnosis not present

## 2021-06-03 DIAGNOSIS — Z1152 Encounter for screening for COVID-19: Secondary | ICD-10-CM | POA: Diagnosis not present

## 2021-06-03 DIAGNOSIS — J449 Chronic obstructive pulmonary disease, unspecified: Secondary | ICD-10-CM | POA: Diagnosis not present

## 2021-06-03 DIAGNOSIS — Z6826 Body mass index (BMI) 26.0-26.9, adult: Secondary | ICD-10-CM | POA: Diagnosis not present

## 2021-06-09 DIAGNOSIS — J302 Other seasonal allergic rhinitis: Secondary | ICD-10-CM | POA: Diagnosis not present

## 2021-06-09 DIAGNOSIS — E785 Hyperlipidemia, unspecified: Secondary | ICD-10-CM | POA: Diagnosis not present

## 2021-06-09 DIAGNOSIS — J439 Emphysema, unspecified: Secondary | ICD-10-CM | POA: Diagnosis not present

## 2021-06-09 DIAGNOSIS — I739 Peripheral vascular disease, unspecified: Secondary | ICD-10-CM | POA: Diagnosis not present

## 2021-06-09 DIAGNOSIS — M069 Rheumatoid arthritis, unspecified: Secondary | ICD-10-CM | POA: Diagnosis not present

## 2021-06-09 DIAGNOSIS — E039 Hypothyroidism, unspecified: Secondary | ICD-10-CM | POA: Diagnosis not present

## 2021-06-09 DIAGNOSIS — R69 Illness, unspecified: Secondary | ICD-10-CM | POA: Diagnosis not present

## 2021-06-09 DIAGNOSIS — Z008 Encounter for other general examination: Secondary | ICD-10-CM | POA: Diagnosis not present

## 2021-06-09 DIAGNOSIS — E663 Overweight: Secondary | ICD-10-CM | POA: Diagnosis not present

## 2021-06-09 DIAGNOSIS — I10 Essential (primary) hypertension: Secondary | ICD-10-CM | POA: Diagnosis not present

## 2021-06-13 DIAGNOSIS — G928 Other toxic encephalopathy: Secondary | ICD-10-CM | POA: Diagnosis not present

## 2021-06-13 DIAGNOSIS — N3001 Acute cystitis with hematuria: Secondary | ICD-10-CM | POA: Diagnosis not present

## 2021-06-13 DIAGNOSIS — K449 Diaphragmatic hernia without obstruction or gangrene: Secondary | ICD-10-CM | POA: Diagnosis not present

## 2021-06-13 DIAGNOSIS — R531 Weakness: Secondary | ICD-10-CM | POA: Diagnosis not present

## 2021-06-13 DIAGNOSIS — I700 Atherosclerosis of aorta: Secondary | ICD-10-CM | POA: Diagnosis not present

## 2021-06-13 DIAGNOSIS — M6282 Rhabdomyolysis: Secondary | ICD-10-CM | POA: Diagnosis not present

## 2021-06-13 DIAGNOSIS — R509 Fever, unspecified: Secondary | ICD-10-CM | POA: Diagnosis not present

## 2021-06-13 DIAGNOSIS — A419 Sepsis, unspecified organism: Secondary | ICD-10-CM | POA: Diagnosis not present

## 2021-06-13 DIAGNOSIS — R404 Transient alteration of awareness: Secondary | ICD-10-CM | POA: Diagnosis not present

## 2021-06-13 DIAGNOSIS — Z743 Need for continuous supervision: Secondary | ICD-10-CM | POA: Diagnosis not present

## 2021-06-13 DIAGNOSIS — R4182 Altered mental status, unspecified: Secondary | ICD-10-CM | POA: Diagnosis not present

## 2021-06-13 DIAGNOSIS — K219 Gastro-esophageal reflux disease without esophagitis: Secondary | ICD-10-CM | POA: Diagnosis not present

## 2021-06-13 DIAGNOSIS — R69 Illness, unspecified: Secondary | ICD-10-CM | POA: Diagnosis not present

## 2021-06-13 DIAGNOSIS — K5730 Diverticulosis of large intestine without perforation or abscess without bleeding: Secondary | ICD-10-CM | POA: Diagnosis not present

## 2021-06-13 DIAGNOSIS — R410 Disorientation, unspecified: Secondary | ICD-10-CM | POA: Diagnosis not present

## 2021-06-13 DIAGNOSIS — N390 Urinary tract infection, site not specified: Secondary | ICD-10-CM | POA: Diagnosis not present

## 2021-06-13 DIAGNOSIS — M810 Age-related osteoporosis without current pathological fracture: Secondary | ICD-10-CM | POA: Diagnosis not present

## 2021-06-13 DIAGNOSIS — E039 Hypothyroidism, unspecified: Secondary | ICD-10-CM | POA: Diagnosis not present

## 2021-06-13 DIAGNOSIS — E785 Hyperlipidemia, unspecified: Secondary | ICD-10-CM | POA: Diagnosis not present

## 2021-06-13 DIAGNOSIS — N200 Calculus of kidney: Secondary | ICD-10-CM | POA: Diagnosis not present

## 2021-06-14 DIAGNOSIS — A419 Sepsis, unspecified organism: Secondary | ICD-10-CM | POA: Diagnosis not present

## 2021-06-14 DIAGNOSIS — G928 Other toxic encephalopathy: Secondary | ICD-10-CM | POA: Diagnosis not present

## 2021-06-14 DIAGNOSIS — M6282 Rhabdomyolysis: Secondary | ICD-10-CM | POA: Diagnosis not present

## 2021-06-15 DIAGNOSIS — M6282 Rhabdomyolysis: Secondary | ICD-10-CM | POA: Diagnosis not present

## 2021-06-15 DIAGNOSIS — G928 Other toxic encephalopathy: Secondary | ICD-10-CM | POA: Diagnosis not present

## 2021-06-15 DIAGNOSIS — A419 Sepsis, unspecified organism: Secondary | ICD-10-CM | POA: Diagnosis not present

## 2021-06-17 DIAGNOSIS — A419 Sepsis, unspecified organism: Secondary | ICD-10-CM | POA: Diagnosis not present

## 2021-06-17 DIAGNOSIS — E876 Hypokalemia: Secondary | ICD-10-CM | POA: Diagnosis not present

## 2021-06-17 DIAGNOSIS — E039 Hypothyroidism, unspecified: Secondary | ICD-10-CM | POA: Diagnosis not present

## 2021-06-17 DIAGNOSIS — E7800 Pure hypercholesterolemia, unspecified: Secondary | ICD-10-CM | POA: Diagnosis not present

## 2021-06-17 DIAGNOSIS — R32 Unspecified urinary incontinence: Secondary | ICD-10-CM | POA: Diagnosis not present

## 2021-06-17 DIAGNOSIS — R69 Illness, unspecified: Secondary | ICD-10-CM | POA: Diagnosis not present

## 2021-06-17 DIAGNOSIS — K219 Gastro-esophageal reflux disease without esophagitis: Secondary | ICD-10-CM | POA: Diagnosis not present

## 2021-06-17 DIAGNOSIS — M810 Age-related osteoporosis without current pathological fracture: Secondary | ICD-10-CM | POA: Diagnosis not present

## 2021-06-17 DIAGNOSIS — N390 Urinary tract infection, site not specified: Secondary | ICD-10-CM | POA: Diagnosis not present

## 2021-06-18 DIAGNOSIS — R32 Unspecified urinary incontinence: Secondary | ICD-10-CM | POA: Diagnosis not present

## 2021-06-18 DIAGNOSIS — A419 Sepsis, unspecified organism: Secondary | ICD-10-CM | POA: Diagnosis not present

## 2021-06-18 DIAGNOSIS — K219 Gastro-esophageal reflux disease without esophagitis: Secondary | ICD-10-CM | POA: Diagnosis not present

## 2021-06-18 DIAGNOSIS — M810 Age-related osteoporosis without current pathological fracture: Secondary | ICD-10-CM | POA: Diagnosis not present

## 2021-06-18 DIAGNOSIS — E876 Hypokalemia: Secondary | ICD-10-CM | POA: Diagnosis not present

## 2021-06-18 DIAGNOSIS — N390 Urinary tract infection, site not specified: Secondary | ICD-10-CM | POA: Diagnosis not present

## 2021-06-18 DIAGNOSIS — E039 Hypothyroidism, unspecified: Secondary | ICD-10-CM | POA: Diagnosis not present

## 2021-06-18 DIAGNOSIS — R69 Illness, unspecified: Secondary | ICD-10-CM | POA: Diagnosis not present

## 2021-06-18 DIAGNOSIS — E7800 Pure hypercholesterolemia, unspecified: Secondary | ICD-10-CM | POA: Diagnosis not present

## 2021-06-22 DIAGNOSIS — I82409 Acute embolism and thrombosis of unspecified deep veins of unspecified lower extremity: Secondary | ICD-10-CM | POA: Diagnosis not present

## 2021-06-22 DIAGNOSIS — N390 Urinary tract infection, site not specified: Secondary | ICD-10-CM | POA: Diagnosis not present

## 2021-06-22 DIAGNOSIS — A419 Sepsis, unspecified organism: Secondary | ICD-10-CM | POA: Diagnosis not present

## 2021-06-22 DIAGNOSIS — R32 Unspecified urinary incontinence: Secondary | ICD-10-CM | POA: Diagnosis not present

## 2021-06-22 DIAGNOSIS — R69 Illness, unspecified: Secondary | ICD-10-CM | POA: Diagnosis not present

## 2021-06-22 DIAGNOSIS — E876 Hypokalemia: Secondary | ICD-10-CM | POA: Diagnosis not present

## 2021-06-22 DIAGNOSIS — K219 Gastro-esophageal reflux disease without esophagitis: Secondary | ICD-10-CM | POA: Diagnosis not present

## 2021-06-22 DIAGNOSIS — R7303 Prediabetes: Secondary | ICD-10-CM | POA: Diagnosis not present

## 2021-06-22 DIAGNOSIS — E7800 Pure hypercholesterolemia, unspecified: Secondary | ICD-10-CM | POA: Diagnosis not present

## 2021-06-22 DIAGNOSIS — I82412 Acute embolism and thrombosis of left femoral vein: Secondary | ICD-10-CM | POA: Diagnosis not present

## 2021-06-22 DIAGNOSIS — M810 Age-related osteoporosis without current pathological fracture: Secondary | ICD-10-CM | POA: Diagnosis not present

## 2021-06-22 DIAGNOSIS — E785 Hyperlipidemia, unspecified: Secondary | ICD-10-CM | POA: Diagnosis not present

## 2021-06-22 DIAGNOSIS — I82442 Acute embolism and thrombosis of left tibial vein: Secondary | ICD-10-CM | POA: Diagnosis not present

## 2021-06-22 DIAGNOSIS — R600 Localized edema: Secondary | ICD-10-CM | POA: Diagnosis not present

## 2021-06-22 DIAGNOSIS — E039 Hypothyroidism, unspecified: Secondary | ICD-10-CM | POA: Diagnosis not present

## 2021-06-22 DIAGNOSIS — Z7689 Persons encountering health services in other specified circumstances: Secondary | ICD-10-CM | POA: Diagnosis not present

## 2021-06-22 DIAGNOSIS — I82432 Acute embolism and thrombosis of left popliteal vein: Secondary | ICD-10-CM | POA: Diagnosis not present

## 2021-06-23 DIAGNOSIS — N390 Urinary tract infection, site not specified: Secondary | ICD-10-CM | POA: Diagnosis not present

## 2021-06-23 DIAGNOSIS — M810 Age-related osteoporosis without current pathological fracture: Secondary | ICD-10-CM | POA: Diagnosis not present

## 2021-06-23 DIAGNOSIS — R32 Unspecified urinary incontinence: Secondary | ICD-10-CM | POA: Diagnosis not present

## 2021-06-23 DIAGNOSIS — K219 Gastro-esophageal reflux disease without esophagitis: Secondary | ICD-10-CM | POA: Diagnosis not present

## 2021-06-23 DIAGNOSIS — E876 Hypokalemia: Secondary | ICD-10-CM | POA: Diagnosis not present

## 2021-06-23 DIAGNOSIS — R69 Illness, unspecified: Secondary | ICD-10-CM | POA: Diagnosis not present

## 2021-06-23 DIAGNOSIS — E7800 Pure hypercholesterolemia, unspecified: Secondary | ICD-10-CM | POA: Diagnosis not present

## 2021-06-23 DIAGNOSIS — A419 Sepsis, unspecified organism: Secondary | ICD-10-CM | POA: Diagnosis not present

## 2021-06-23 DIAGNOSIS — E039 Hypothyroidism, unspecified: Secondary | ICD-10-CM | POA: Diagnosis not present

## 2021-06-28 DIAGNOSIS — R69 Illness, unspecified: Secondary | ICD-10-CM | POA: Diagnosis not present

## 2021-06-28 DIAGNOSIS — E039 Hypothyroidism, unspecified: Secondary | ICD-10-CM | POA: Diagnosis not present

## 2021-06-28 DIAGNOSIS — M810 Age-related osteoporosis without current pathological fracture: Secondary | ICD-10-CM | POA: Diagnosis not present

## 2021-06-28 DIAGNOSIS — K219 Gastro-esophageal reflux disease without esophagitis: Secondary | ICD-10-CM | POA: Diagnosis not present

## 2021-06-28 DIAGNOSIS — R32 Unspecified urinary incontinence: Secondary | ICD-10-CM | POA: Diagnosis not present

## 2021-06-28 DIAGNOSIS — N390 Urinary tract infection, site not specified: Secondary | ICD-10-CM | POA: Diagnosis not present

## 2021-06-28 DIAGNOSIS — E7800 Pure hypercholesterolemia, unspecified: Secondary | ICD-10-CM | POA: Diagnosis not present

## 2021-06-28 DIAGNOSIS — E876 Hypokalemia: Secondary | ICD-10-CM | POA: Diagnosis not present

## 2021-06-28 DIAGNOSIS — A419 Sepsis, unspecified organism: Secondary | ICD-10-CM | POA: Diagnosis not present

## 2021-06-29 DIAGNOSIS — E876 Hypokalemia: Secondary | ICD-10-CM | POA: Diagnosis not present

## 2021-06-29 DIAGNOSIS — R69 Illness, unspecified: Secondary | ICD-10-CM | POA: Diagnosis not present

## 2021-06-29 DIAGNOSIS — E7800 Pure hypercholesterolemia, unspecified: Secondary | ICD-10-CM | POA: Diagnosis not present

## 2021-06-29 DIAGNOSIS — A419 Sepsis, unspecified organism: Secondary | ICD-10-CM | POA: Diagnosis not present

## 2021-06-29 DIAGNOSIS — N390 Urinary tract infection, site not specified: Secondary | ICD-10-CM | POA: Diagnosis not present

## 2021-06-29 DIAGNOSIS — M810 Age-related osteoporosis without current pathological fracture: Secondary | ICD-10-CM | POA: Diagnosis not present

## 2021-06-29 DIAGNOSIS — R32 Unspecified urinary incontinence: Secondary | ICD-10-CM | POA: Diagnosis not present

## 2021-06-29 DIAGNOSIS — K219 Gastro-esophageal reflux disease without esophagitis: Secondary | ICD-10-CM | POA: Diagnosis not present

## 2021-06-29 DIAGNOSIS — E039 Hypothyroidism, unspecified: Secondary | ICD-10-CM | POA: Diagnosis not present

## 2021-06-30 DIAGNOSIS — E7800 Pure hypercholesterolemia, unspecified: Secondary | ICD-10-CM | POA: Diagnosis not present

## 2021-06-30 DIAGNOSIS — K219 Gastro-esophageal reflux disease without esophagitis: Secondary | ICD-10-CM | POA: Diagnosis not present

## 2021-06-30 DIAGNOSIS — R32 Unspecified urinary incontinence: Secondary | ICD-10-CM | POA: Diagnosis not present

## 2021-06-30 DIAGNOSIS — N390 Urinary tract infection, site not specified: Secondary | ICD-10-CM | POA: Diagnosis not present

## 2021-06-30 DIAGNOSIS — E876 Hypokalemia: Secondary | ICD-10-CM | POA: Diagnosis not present

## 2021-06-30 DIAGNOSIS — M810 Age-related osteoporosis without current pathological fracture: Secondary | ICD-10-CM | POA: Diagnosis not present

## 2021-06-30 DIAGNOSIS — A419 Sepsis, unspecified organism: Secondary | ICD-10-CM | POA: Diagnosis not present

## 2021-06-30 DIAGNOSIS — E039 Hypothyroidism, unspecified: Secondary | ICD-10-CM | POA: Diagnosis not present

## 2021-06-30 DIAGNOSIS — R69 Illness, unspecified: Secondary | ICD-10-CM | POA: Diagnosis not present

## 2021-07-01 DIAGNOSIS — E7800 Pure hypercholesterolemia, unspecified: Secondary | ICD-10-CM | POA: Diagnosis not present

## 2021-07-01 DIAGNOSIS — K219 Gastro-esophageal reflux disease without esophagitis: Secondary | ICD-10-CM | POA: Diagnosis not present

## 2021-07-01 DIAGNOSIS — E039 Hypothyroidism, unspecified: Secondary | ICD-10-CM | POA: Diagnosis not present

## 2021-07-01 DIAGNOSIS — M810 Age-related osteoporosis without current pathological fracture: Secondary | ICD-10-CM | POA: Diagnosis not present

## 2021-07-01 DIAGNOSIS — N390 Urinary tract infection, site not specified: Secondary | ICD-10-CM | POA: Diagnosis not present

## 2021-07-01 DIAGNOSIS — A419 Sepsis, unspecified organism: Secondary | ICD-10-CM | POA: Diagnosis not present

## 2021-07-01 DIAGNOSIS — R32 Unspecified urinary incontinence: Secondary | ICD-10-CM | POA: Diagnosis not present

## 2021-07-01 DIAGNOSIS — R69 Illness, unspecified: Secondary | ICD-10-CM | POA: Diagnosis not present

## 2021-07-01 DIAGNOSIS — E876 Hypokalemia: Secondary | ICD-10-CM | POA: Diagnosis not present

## 2021-07-02 DIAGNOSIS — I82409 Acute embolism and thrombosis of unspecified deep veins of unspecified lower extremity: Secondary | ICD-10-CM | POA: Diagnosis not present

## 2021-07-02 DIAGNOSIS — R7303 Prediabetes: Secondary | ICD-10-CM | POA: Diagnosis not present

## 2021-07-02 DIAGNOSIS — E876 Hypokalemia: Secondary | ICD-10-CM | POA: Diagnosis not present

## 2021-07-02 DIAGNOSIS — D72829 Elevated white blood cell count, unspecified: Secondary | ICD-10-CM | POA: Diagnosis not present

## 2021-07-05 DIAGNOSIS — N390 Urinary tract infection, site not specified: Secondary | ICD-10-CM | POA: Diagnosis not present

## 2021-07-05 DIAGNOSIS — K219 Gastro-esophageal reflux disease without esophagitis: Secondary | ICD-10-CM | POA: Diagnosis not present

## 2021-07-05 DIAGNOSIS — E7800 Pure hypercholesterolemia, unspecified: Secondary | ICD-10-CM | POA: Diagnosis not present

## 2021-07-05 DIAGNOSIS — D72829 Elevated white blood cell count, unspecified: Secondary | ICD-10-CM | POA: Diagnosis not present

## 2021-07-05 DIAGNOSIS — R69 Illness, unspecified: Secondary | ICD-10-CM | POA: Diagnosis not present

## 2021-07-05 DIAGNOSIS — E039 Hypothyroidism, unspecified: Secondary | ICD-10-CM | POA: Diagnosis not present

## 2021-07-05 DIAGNOSIS — A419 Sepsis, unspecified organism: Secondary | ICD-10-CM | POA: Diagnosis not present

## 2021-07-05 DIAGNOSIS — R32 Unspecified urinary incontinence: Secondary | ICD-10-CM | POA: Diagnosis not present

## 2021-07-05 DIAGNOSIS — E876 Hypokalemia: Secondary | ICD-10-CM | POA: Diagnosis not present

## 2021-07-05 DIAGNOSIS — M810 Age-related osteoporosis without current pathological fracture: Secondary | ICD-10-CM | POA: Diagnosis not present

## 2021-07-06 DIAGNOSIS — E876 Hypokalemia: Secondary | ICD-10-CM | POA: Diagnosis not present

## 2021-07-06 DIAGNOSIS — A419 Sepsis, unspecified organism: Secondary | ICD-10-CM | POA: Diagnosis not present

## 2021-07-06 DIAGNOSIS — E7800 Pure hypercholesterolemia, unspecified: Secondary | ICD-10-CM | POA: Diagnosis not present

## 2021-07-06 DIAGNOSIS — K219 Gastro-esophageal reflux disease without esophagitis: Secondary | ICD-10-CM | POA: Diagnosis not present

## 2021-07-06 DIAGNOSIS — R69 Illness, unspecified: Secondary | ICD-10-CM | POA: Diagnosis not present

## 2021-07-06 DIAGNOSIS — M810 Age-related osteoporosis without current pathological fracture: Secondary | ICD-10-CM | POA: Diagnosis not present

## 2021-07-06 DIAGNOSIS — E039 Hypothyroidism, unspecified: Secondary | ICD-10-CM | POA: Diagnosis not present

## 2021-07-06 DIAGNOSIS — R32 Unspecified urinary incontinence: Secondary | ICD-10-CM | POA: Diagnosis not present

## 2021-07-06 DIAGNOSIS — N390 Urinary tract infection, site not specified: Secondary | ICD-10-CM | POA: Diagnosis not present

## 2021-07-08 DIAGNOSIS — E039 Hypothyroidism, unspecified: Secondary | ICD-10-CM | POA: Diagnosis not present

## 2021-07-08 DIAGNOSIS — M810 Age-related osteoporosis without current pathological fracture: Secondary | ICD-10-CM | POA: Diagnosis not present

## 2021-07-08 DIAGNOSIS — R32 Unspecified urinary incontinence: Secondary | ICD-10-CM | POA: Diagnosis not present

## 2021-07-08 DIAGNOSIS — E7800 Pure hypercholesterolemia, unspecified: Secondary | ICD-10-CM | POA: Diagnosis not present

## 2021-07-08 DIAGNOSIS — N390 Urinary tract infection, site not specified: Secondary | ICD-10-CM | POA: Diagnosis not present

## 2021-07-08 DIAGNOSIS — R69 Illness, unspecified: Secondary | ICD-10-CM | POA: Diagnosis not present

## 2021-07-08 DIAGNOSIS — A419 Sepsis, unspecified organism: Secondary | ICD-10-CM | POA: Diagnosis not present

## 2021-07-08 DIAGNOSIS — K219 Gastro-esophageal reflux disease without esophagitis: Secondary | ICD-10-CM | POA: Diagnosis not present

## 2021-07-08 DIAGNOSIS — E876 Hypokalemia: Secondary | ICD-10-CM | POA: Diagnosis not present

## 2021-07-09 DIAGNOSIS — K219 Gastro-esophageal reflux disease without esophagitis: Secondary | ICD-10-CM | POA: Diagnosis not present

## 2021-07-09 DIAGNOSIS — A419 Sepsis, unspecified organism: Secondary | ICD-10-CM | POA: Diagnosis not present

## 2021-07-09 DIAGNOSIS — E039 Hypothyroidism, unspecified: Secondary | ICD-10-CM | POA: Diagnosis not present

## 2021-07-09 DIAGNOSIS — E876 Hypokalemia: Secondary | ICD-10-CM | POA: Diagnosis not present

## 2021-07-09 DIAGNOSIS — R69 Illness, unspecified: Secondary | ICD-10-CM | POA: Diagnosis not present

## 2021-07-09 DIAGNOSIS — R32 Unspecified urinary incontinence: Secondary | ICD-10-CM | POA: Diagnosis not present

## 2021-07-09 DIAGNOSIS — N390 Urinary tract infection, site not specified: Secondary | ICD-10-CM | POA: Diagnosis not present

## 2021-07-09 DIAGNOSIS — M810 Age-related osteoporosis without current pathological fracture: Secondary | ICD-10-CM | POA: Diagnosis not present

## 2021-07-09 DIAGNOSIS — E7800 Pure hypercholesterolemia, unspecified: Secondary | ICD-10-CM | POA: Diagnosis not present

## 2021-07-12 DIAGNOSIS — R69 Illness, unspecified: Secondary | ICD-10-CM | POA: Diagnosis not present

## 2021-07-12 DIAGNOSIS — N390 Urinary tract infection, site not specified: Secondary | ICD-10-CM | POA: Diagnosis not present

## 2021-07-12 DIAGNOSIS — M810 Age-related osteoporosis without current pathological fracture: Secondary | ICD-10-CM | POA: Diagnosis not present

## 2021-07-12 DIAGNOSIS — E7800 Pure hypercholesterolemia, unspecified: Secondary | ICD-10-CM | POA: Diagnosis not present

## 2021-07-12 DIAGNOSIS — R32 Unspecified urinary incontinence: Secondary | ICD-10-CM | POA: Diagnosis not present

## 2021-07-12 DIAGNOSIS — K219 Gastro-esophageal reflux disease without esophagitis: Secondary | ICD-10-CM | POA: Diagnosis not present

## 2021-07-12 DIAGNOSIS — E876 Hypokalemia: Secondary | ICD-10-CM | POA: Diagnosis not present

## 2021-07-12 DIAGNOSIS — E039 Hypothyroidism, unspecified: Secondary | ICD-10-CM | POA: Diagnosis not present

## 2021-07-12 DIAGNOSIS — A419 Sepsis, unspecified organism: Secondary | ICD-10-CM | POA: Diagnosis not present

## 2021-07-13 DIAGNOSIS — R69 Illness, unspecified: Secondary | ICD-10-CM | POA: Diagnosis not present

## 2021-07-13 DIAGNOSIS — R32 Unspecified urinary incontinence: Secondary | ICD-10-CM | POA: Diagnosis not present

## 2021-07-13 DIAGNOSIS — E7800 Pure hypercholesterolemia, unspecified: Secondary | ICD-10-CM | POA: Diagnosis not present

## 2021-07-13 DIAGNOSIS — K219 Gastro-esophageal reflux disease without esophagitis: Secondary | ICD-10-CM | POA: Diagnosis not present

## 2021-07-13 DIAGNOSIS — N390 Urinary tract infection, site not specified: Secondary | ICD-10-CM | POA: Diagnosis not present

## 2021-07-13 DIAGNOSIS — E876 Hypokalemia: Secondary | ICD-10-CM | POA: Diagnosis not present

## 2021-07-13 DIAGNOSIS — A419 Sepsis, unspecified organism: Secondary | ICD-10-CM | POA: Diagnosis not present

## 2021-07-13 DIAGNOSIS — E039 Hypothyroidism, unspecified: Secondary | ICD-10-CM | POA: Diagnosis not present

## 2021-07-13 DIAGNOSIS — M810 Age-related osteoporosis without current pathological fracture: Secondary | ICD-10-CM | POA: Diagnosis not present

## 2021-07-20 DIAGNOSIS — H2512 Age-related nuclear cataract, left eye: Secondary | ICD-10-CM | POA: Diagnosis not present

## 2021-07-20 DIAGNOSIS — H25043 Posterior subcapsular polar age-related cataract, bilateral: Secondary | ICD-10-CM | POA: Diagnosis not present

## 2021-07-20 DIAGNOSIS — H18413 Arcus senilis, bilateral: Secondary | ICD-10-CM | POA: Diagnosis not present

## 2021-07-20 DIAGNOSIS — H25013 Cortical age-related cataract, bilateral: Secondary | ICD-10-CM | POA: Diagnosis not present

## 2021-07-20 DIAGNOSIS — H2513 Age-related nuclear cataract, bilateral: Secondary | ICD-10-CM | POA: Diagnosis not present

## 2021-07-27 DIAGNOSIS — E7800 Pure hypercholesterolemia, unspecified: Secondary | ICD-10-CM | POA: Diagnosis not present

## 2021-07-27 DIAGNOSIS — N390 Urinary tract infection, site not specified: Secondary | ICD-10-CM | POA: Diagnosis not present

## 2021-07-27 DIAGNOSIS — K219 Gastro-esophageal reflux disease without esophagitis: Secondary | ICD-10-CM | POA: Diagnosis not present

## 2021-07-27 DIAGNOSIS — R69 Illness, unspecified: Secondary | ICD-10-CM | POA: Diagnosis not present

## 2021-07-27 DIAGNOSIS — A419 Sepsis, unspecified organism: Secondary | ICD-10-CM | POA: Diagnosis not present

## 2021-07-27 DIAGNOSIS — E876 Hypokalemia: Secondary | ICD-10-CM | POA: Diagnosis not present

## 2021-07-27 DIAGNOSIS — R32 Unspecified urinary incontinence: Secondary | ICD-10-CM | POA: Diagnosis not present

## 2021-07-27 DIAGNOSIS — E039 Hypothyroidism, unspecified: Secondary | ICD-10-CM | POA: Diagnosis not present

## 2021-07-27 DIAGNOSIS — M810 Age-related osteoporosis without current pathological fracture: Secondary | ICD-10-CM | POA: Diagnosis not present

## 2021-08-03 DIAGNOSIS — D72829 Elevated white blood cell count, unspecified: Secondary | ICD-10-CM | POA: Diagnosis not present

## 2021-08-10 DIAGNOSIS — E876 Hypokalemia: Secondary | ICD-10-CM | POA: Diagnosis not present

## 2021-08-10 DIAGNOSIS — E039 Hypothyroidism, unspecified: Secondary | ICD-10-CM | POA: Diagnosis not present

## 2021-08-10 DIAGNOSIS — M810 Age-related osteoporosis without current pathological fracture: Secondary | ICD-10-CM | POA: Diagnosis not present

## 2021-08-10 DIAGNOSIS — R69 Illness, unspecified: Secondary | ICD-10-CM | POA: Diagnosis not present

## 2021-08-10 DIAGNOSIS — R32 Unspecified urinary incontinence: Secondary | ICD-10-CM | POA: Diagnosis not present

## 2021-08-10 DIAGNOSIS — N390 Urinary tract infection, site not specified: Secondary | ICD-10-CM | POA: Diagnosis not present

## 2021-08-10 DIAGNOSIS — K219 Gastro-esophageal reflux disease without esophagitis: Secondary | ICD-10-CM | POA: Diagnosis not present

## 2021-08-10 DIAGNOSIS — E7800 Pure hypercholesterolemia, unspecified: Secondary | ICD-10-CM | POA: Diagnosis not present

## 2021-08-10 DIAGNOSIS — A419 Sepsis, unspecified organism: Secondary | ICD-10-CM | POA: Diagnosis not present

## 2021-09-09 DIAGNOSIS — R011 Cardiac murmur, unspecified: Secondary | ICD-10-CM | POA: Diagnosis not present

## 2021-09-09 DIAGNOSIS — Z139 Encounter for screening, unspecified: Secondary | ICD-10-CM | POA: Diagnosis not present

## 2021-09-09 DIAGNOSIS — I82409 Acute embolism and thrombosis of unspecified deep veins of unspecified lower extremity: Secondary | ICD-10-CM | POA: Diagnosis not present

## 2021-09-09 DIAGNOSIS — G4700 Insomnia, unspecified: Secondary | ICD-10-CM | POA: Diagnosis not present

## 2021-09-22 DIAGNOSIS — G4700 Insomnia, unspecified: Secondary | ICD-10-CM | POA: Diagnosis not present

## 2021-09-22 DIAGNOSIS — R300 Dysuria: Secondary | ICD-10-CM | POA: Diagnosis not present

## 2021-09-22 DIAGNOSIS — Z6824 Body mass index (BMI) 24.0-24.9, adult: Secondary | ICD-10-CM | POA: Diagnosis not present

## 2021-10-13 DIAGNOSIS — J029 Acute pharyngitis, unspecified: Secondary | ICD-10-CM | POA: Diagnosis not present

## 2021-10-13 DIAGNOSIS — Z20822 Contact with and (suspected) exposure to covid-19: Secondary | ICD-10-CM | POA: Diagnosis not present

## 2021-10-13 DIAGNOSIS — J329 Chronic sinusitis, unspecified: Secondary | ICD-10-CM | POA: Diagnosis not present

## 2021-10-20 DIAGNOSIS — Z6824 Body mass index (BMI) 24.0-24.9, adult: Secondary | ICD-10-CM | POA: Diagnosis not present

## 2021-10-20 DIAGNOSIS — R0789 Other chest pain: Secondary | ICD-10-CM | POA: Diagnosis not present

## 2021-10-20 DIAGNOSIS — K219 Gastro-esophageal reflux disease without esophagitis: Secondary | ICD-10-CM | POA: Diagnosis not present

## 2021-10-20 DIAGNOSIS — R9431 Abnormal electrocardiogram [ECG] [EKG]: Secondary | ICD-10-CM | POA: Diagnosis not present

## 2021-11-03 DIAGNOSIS — Z1331 Encounter for screening for depression: Secondary | ICD-10-CM | POA: Diagnosis not present

## 2021-11-03 DIAGNOSIS — Z139 Encounter for screening, unspecified: Secondary | ICD-10-CM | POA: Diagnosis not present

## 2021-11-03 DIAGNOSIS — Z1339 Encounter for screening examination for other mental health and behavioral disorders: Secondary | ICD-10-CM | POA: Diagnosis not present

## 2021-11-03 DIAGNOSIS — R9431 Abnormal electrocardiogram [ECG] [EKG]: Secondary | ICD-10-CM | POA: Diagnosis not present

## 2021-11-03 DIAGNOSIS — R69 Illness, unspecified: Secondary | ICD-10-CM | POA: Diagnosis not present

## 2021-11-03 DIAGNOSIS — E876 Hypokalemia: Secondary | ICD-10-CM | POA: Diagnosis not present

## 2021-11-03 DIAGNOSIS — E039 Hypothyroidism, unspecified: Secondary | ICD-10-CM | POA: Diagnosis not present

## 2021-11-03 DIAGNOSIS — Z0000 Encounter for general adult medical examination without abnormal findings: Secondary | ICD-10-CM | POA: Diagnosis not present

## 2021-11-17 DIAGNOSIS — J0100 Acute maxillary sinusitis, unspecified: Secondary | ICD-10-CM | POA: Diagnosis not present

## 2021-11-17 DIAGNOSIS — Z20822 Contact with and (suspected) exposure to covid-19: Secondary | ICD-10-CM | POA: Diagnosis not present

## 2021-11-17 DIAGNOSIS — Z6824 Body mass index (BMI) 24.0-24.9, adult: Secondary | ICD-10-CM | POA: Diagnosis not present

## 2021-11-17 DIAGNOSIS — J029 Acute pharyngitis, unspecified: Secondary | ICD-10-CM | POA: Diagnosis not present

## 2021-11-17 DIAGNOSIS — J020 Streptococcal pharyngitis: Secondary | ICD-10-CM | POA: Diagnosis not present

## 2021-11-25 DIAGNOSIS — H547 Unspecified visual loss: Secondary | ICD-10-CM | POA: Diagnosis not present

## 2021-11-25 DIAGNOSIS — E039 Hypothyroidism, unspecified: Secondary | ICD-10-CM | POA: Diagnosis not present

## 2021-11-25 DIAGNOSIS — E663 Overweight: Secondary | ICD-10-CM | POA: Diagnosis not present

## 2021-11-25 DIAGNOSIS — M069 Rheumatoid arthritis, unspecified: Secondary | ICD-10-CM | POA: Diagnosis not present

## 2021-11-25 DIAGNOSIS — E785 Hyperlipidemia, unspecified: Secondary | ICD-10-CM | POA: Diagnosis not present

## 2021-11-25 DIAGNOSIS — R69 Illness, unspecified: Secondary | ICD-10-CM | POA: Diagnosis not present

## 2021-11-25 DIAGNOSIS — K219 Gastro-esophageal reflux disease without esophagitis: Secondary | ICD-10-CM | POA: Diagnosis not present

## 2021-11-25 DIAGNOSIS — Z008 Encounter for other general examination: Secondary | ICD-10-CM | POA: Diagnosis not present

## 2021-11-25 DIAGNOSIS — G8929 Other chronic pain: Secondary | ICD-10-CM | POA: Diagnosis not present

## 2021-11-25 DIAGNOSIS — I739 Peripheral vascular disease, unspecified: Secondary | ICD-10-CM | POA: Diagnosis not present

## 2021-11-25 DIAGNOSIS — J302 Other seasonal allergic rhinitis: Secondary | ICD-10-CM | POA: Diagnosis not present

## 2021-11-30 DIAGNOSIS — E876 Hypokalemia: Secondary | ICD-10-CM | POA: Diagnosis not present

## 2021-11-30 DIAGNOSIS — R7303 Prediabetes: Secondary | ICD-10-CM | POA: Diagnosis not present

## 2021-11-30 DIAGNOSIS — Z7901 Long term (current) use of anticoagulants: Secondary | ICD-10-CM | POA: Diagnosis not present

## 2021-11-30 DIAGNOSIS — E039 Hypothyroidism, unspecified: Secondary | ICD-10-CM | POA: Diagnosis not present

## 2021-11-30 DIAGNOSIS — E785 Hyperlipidemia, unspecified: Secondary | ICD-10-CM | POA: Diagnosis not present

## 2021-12-08 DIAGNOSIS — E039 Hypothyroidism, unspecified: Secondary | ICD-10-CM | POA: Diagnosis not present

## 2021-12-08 DIAGNOSIS — J449 Chronic obstructive pulmonary disease, unspecified: Secondary | ICD-10-CM | POA: Diagnosis not present

## 2021-12-08 DIAGNOSIS — R7303 Prediabetes: Secondary | ICD-10-CM | POA: Diagnosis not present

## 2021-12-08 DIAGNOSIS — E785 Hyperlipidemia, unspecified: Secondary | ICD-10-CM | POA: Diagnosis not present

## 2021-12-14 DIAGNOSIS — R350 Frequency of micturition: Secondary | ICD-10-CM | POA: Diagnosis not present

## 2021-12-14 DIAGNOSIS — R112 Nausea with vomiting, unspecified: Secondary | ICD-10-CM | POA: Diagnosis not present

## 2021-12-14 DIAGNOSIS — N390 Urinary tract infection, site not specified: Secondary | ICD-10-CM | POA: Diagnosis not present

## 2021-12-14 DIAGNOSIS — Z6824 Body mass index (BMI) 24.0-24.9, adult: Secondary | ICD-10-CM | POA: Diagnosis not present

## 2021-12-16 DIAGNOSIS — E860 Dehydration: Secondary | ICD-10-CM | POA: Diagnosis not present

## 2021-12-16 DIAGNOSIS — N390 Urinary tract infection, site not specified: Secondary | ICD-10-CM | POA: Diagnosis not present

## 2021-12-22 DIAGNOSIS — J449 Chronic obstructive pulmonary disease, unspecified: Secondary | ICD-10-CM | POA: Diagnosis not present

## 2021-12-30 DIAGNOSIS — G4730 Sleep apnea, unspecified: Secondary | ICD-10-CM | POA: Diagnosis not present

## 2021-12-30 DIAGNOSIS — I739 Peripheral vascular disease, unspecified: Secondary | ICD-10-CM | POA: Diagnosis not present

## 2021-12-30 DIAGNOSIS — Z6824 Body mass index (BMI) 24.0-24.9, adult: Secondary | ICD-10-CM | POA: Diagnosis not present

## 2021-12-30 DIAGNOSIS — R42 Dizziness and giddiness: Secondary | ICD-10-CM | POA: Diagnosis not present

## 2022-01-04 DIAGNOSIS — R69 Illness, unspecified: Secondary | ICD-10-CM | POA: Diagnosis not present

## 2022-01-04 DIAGNOSIS — F411 Generalized anxiety disorder: Secondary | ICD-10-CM | POA: Diagnosis not present

## 2022-01-04 DIAGNOSIS — F410 Panic disorder [episodic paroxysmal anxiety] without agoraphobia: Secondary | ICD-10-CM | POA: Diagnosis not present

## 2022-01-04 DIAGNOSIS — Z6823 Body mass index (BMI) 23.0-23.9, adult: Secondary | ICD-10-CM | POA: Diagnosis not present

## 2022-02-01 DIAGNOSIS — H8110 Benign paroxysmal vertigo, unspecified ear: Secondary | ICD-10-CM | POA: Diagnosis not present

## 2022-02-01 DIAGNOSIS — H7291 Unspecified perforation of tympanic membrane, right ear: Secondary | ICD-10-CM | POA: Diagnosis not present

## 2022-02-01 DIAGNOSIS — H9193 Unspecified hearing loss, bilateral: Secondary | ICD-10-CM | POA: Diagnosis not present

## 2022-02-01 DIAGNOSIS — Z974 Presence of external hearing-aid: Secondary | ICD-10-CM | POA: Diagnosis not present

## 2022-02-01 DIAGNOSIS — R42 Dizziness and giddiness: Secondary | ICD-10-CM | POA: Diagnosis not present

## 2022-03-29 DIAGNOSIS — Z6823 Body mass index (BMI) 23.0-23.9, adult: Secondary | ICD-10-CM | POA: Diagnosis not present

## 2022-03-29 DIAGNOSIS — R42 Dizziness and giddiness: Secondary | ICD-10-CM | POA: Diagnosis not present

## 2022-03-29 DIAGNOSIS — G4730 Sleep apnea, unspecified: Secondary | ICD-10-CM | POA: Diagnosis not present

## 2022-03-30 DIAGNOSIS — H8110 Benign paroxysmal vertigo, unspecified ear: Secondary | ICD-10-CM | POA: Diagnosis not present

## 2022-03-30 DIAGNOSIS — H5509 Other forms of nystagmus: Secondary | ICD-10-CM | POA: Diagnosis not present

## 2022-04-05 DIAGNOSIS — H8110 Benign paroxysmal vertigo, unspecified ear: Secondary | ICD-10-CM | POA: Diagnosis not present

## 2022-04-05 DIAGNOSIS — H5509 Other forms of nystagmus: Secondary | ICD-10-CM | POA: Diagnosis not present

## 2022-04-19 DIAGNOSIS — S00411A Abrasion of right ear, initial encounter: Secondary | ICD-10-CM | POA: Diagnosis not present

## 2022-04-19 DIAGNOSIS — Z9289 Personal history of other medical treatment: Secondary | ICD-10-CM | POA: Diagnosis not present

## 2022-04-19 DIAGNOSIS — Z6823 Body mass index (BMI) 23.0-23.9, adult: Secondary | ICD-10-CM | POA: Diagnosis not present

## 2022-04-19 DIAGNOSIS — H9201 Otalgia, right ear: Secondary | ICD-10-CM | POA: Diagnosis not present

## 2022-05-31 DIAGNOSIS — E785 Hyperlipidemia, unspecified: Secondary | ICD-10-CM | POA: Diagnosis not present

## 2022-05-31 DIAGNOSIS — R7303 Prediabetes: Secondary | ICD-10-CM | POA: Diagnosis not present

## 2022-05-31 DIAGNOSIS — E039 Hypothyroidism, unspecified: Secondary | ICD-10-CM | POA: Diagnosis not present

## 2022-06-07 DIAGNOSIS — Z86718 Personal history of other venous thrombosis and embolism: Secondary | ICD-10-CM | POA: Diagnosis not present

## 2022-06-07 DIAGNOSIS — E039 Hypothyroidism, unspecified: Secondary | ICD-10-CM | POA: Diagnosis not present

## 2022-06-07 DIAGNOSIS — R7303 Prediabetes: Secondary | ICD-10-CM | POA: Diagnosis not present

## 2022-06-07 DIAGNOSIS — Z6824 Body mass index (BMI) 24.0-24.9, adult: Secondary | ICD-10-CM | POA: Diagnosis not present

## 2022-06-07 DIAGNOSIS — E785 Hyperlipidemia, unspecified: Secondary | ICD-10-CM | POA: Diagnosis not present

## 2022-06-14 DIAGNOSIS — Z6824 Body mass index (BMI) 24.0-24.9, adult: Secondary | ICD-10-CM | POA: Diagnosis not present

## 2022-06-14 DIAGNOSIS — M79662 Pain in left lower leg: Secondary | ICD-10-CM | POA: Diagnosis not present

## 2022-06-14 DIAGNOSIS — Z86718 Personal history of other venous thrombosis and embolism: Secondary | ICD-10-CM | POA: Diagnosis not present

## 2022-06-14 DIAGNOSIS — M7989 Other specified soft tissue disorders: Secondary | ICD-10-CM | POA: Diagnosis not present

## 2022-06-16 DIAGNOSIS — Z86718 Personal history of other venous thrombosis and embolism: Secondary | ICD-10-CM | POA: Diagnosis not present

## 2022-06-16 DIAGNOSIS — M7989 Other specified soft tissue disorders: Secondary | ICD-10-CM | POA: Diagnosis not present

## 2022-06-16 DIAGNOSIS — M79662 Pain in left lower leg: Secondary | ICD-10-CM | POA: Diagnosis not present

## 2022-06-21 DIAGNOSIS — Z6823 Body mass index (BMI) 23.0-23.9, adult: Secondary | ICD-10-CM | POA: Diagnosis not present

## 2022-06-21 DIAGNOSIS — K529 Noninfective gastroenteritis and colitis, unspecified: Secondary | ICD-10-CM | POA: Diagnosis not present

## 2022-06-21 DIAGNOSIS — Z7901 Long term (current) use of anticoagulants: Secondary | ICD-10-CM | POA: Diagnosis not present

## 2022-06-21 DIAGNOSIS — Z86718 Personal history of other venous thrombosis and embolism: Secondary | ICD-10-CM | POA: Diagnosis not present

## 2022-06-28 DIAGNOSIS — Z6823 Body mass index (BMI) 23.0-23.9, adult: Secondary | ICD-10-CM | POA: Diagnosis not present

## 2022-06-28 DIAGNOSIS — Z7901 Long term (current) use of anticoagulants: Secondary | ICD-10-CM | POA: Diagnosis not present

## 2022-06-28 DIAGNOSIS — Z23 Encounter for immunization: Secondary | ICD-10-CM | POA: Diagnosis not present

## 2022-07-01 DIAGNOSIS — Z6824 Body mass index (BMI) 24.0-24.9, adult: Secondary | ICD-10-CM | POA: Diagnosis not present

## 2022-07-01 DIAGNOSIS — Z7901 Long term (current) use of anticoagulants: Secondary | ICD-10-CM | POA: Diagnosis not present

## 2022-07-01 DIAGNOSIS — Z86718 Personal history of other venous thrombosis and embolism: Secondary | ICD-10-CM | POA: Diagnosis not present

## 2022-07-08 DIAGNOSIS — Z6823 Body mass index (BMI) 23.0-23.9, adult: Secondary | ICD-10-CM | POA: Diagnosis not present

## 2022-07-08 DIAGNOSIS — Z131 Encounter for screening for diabetes mellitus: Secondary | ICD-10-CM | POA: Diagnosis not present

## 2022-07-08 DIAGNOSIS — Z7901 Long term (current) use of anticoagulants: Secondary | ICD-10-CM | POA: Diagnosis not present

## 2022-07-15 DIAGNOSIS — Z6824 Body mass index (BMI) 24.0-24.9, adult: Secondary | ICD-10-CM | POA: Diagnosis not present

## 2022-07-15 DIAGNOSIS — Z7901 Long term (current) use of anticoagulants: Secondary | ICD-10-CM | POA: Diagnosis not present

## 2022-07-15 DIAGNOSIS — S76312A Strain of muscle, fascia and tendon of the posterior muscle group at thigh level, left thigh, initial encounter: Secondary | ICD-10-CM | POA: Diagnosis not present

## 2022-07-21 DIAGNOSIS — R0789 Other chest pain: Secondary | ICD-10-CM | POA: Diagnosis not present

## 2022-07-21 DIAGNOSIS — F410 Panic disorder [episodic paroxysmal anxiety] without agoraphobia: Secondary | ICD-10-CM | POA: Diagnosis not present

## 2022-07-21 DIAGNOSIS — Z7901 Long term (current) use of anticoagulants: Secondary | ICD-10-CM | POA: Diagnosis not present

## 2022-07-21 DIAGNOSIS — Z6823 Body mass index (BMI) 23.0-23.9, adult: Secondary | ICD-10-CM | POA: Diagnosis not present

## 2022-07-21 DIAGNOSIS — R69 Illness, unspecified: Secondary | ICD-10-CM | POA: Diagnosis not present

## 2022-07-21 DIAGNOSIS — E876 Hypokalemia: Secondary | ICD-10-CM | POA: Diagnosis not present

## 2022-08-04 DIAGNOSIS — Z6824 Body mass index (BMI) 24.0-24.9, adult: Secondary | ICD-10-CM | POA: Diagnosis not present

## 2022-08-04 DIAGNOSIS — D6869 Other thrombophilia: Secondary | ICD-10-CM | POA: Diagnosis not present

## 2022-08-04 DIAGNOSIS — Z86718 Personal history of other venous thrombosis and embolism: Secondary | ICD-10-CM | POA: Diagnosis not present

## 2022-08-04 DIAGNOSIS — Z7901 Long term (current) use of anticoagulants: Secondary | ICD-10-CM | POA: Diagnosis not present

## 2022-08-18 DIAGNOSIS — Z7901 Long term (current) use of anticoagulants: Secondary | ICD-10-CM | POA: Diagnosis not present

## 2022-08-18 DIAGNOSIS — Z1331 Encounter for screening for depression: Secondary | ICD-10-CM | POA: Diagnosis not present

## 2022-08-18 DIAGNOSIS — Z20822 Contact with and (suspected) exposure to covid-19: Secondary | ICD-10-CM | POA: Diagnosis not present

## 2022-08-18 DIAGNOSIS — Z6823 Body mass index (BMI) 23.0-23.9, adult: Secondary | ICD-10-CM | POA: Diagnosis not present

## 2022-08-18 DIAGNOSIS — J029 Acute pharyngitis, unspecified: Secondary | ICD-10-CM | POA: Diagnosis not present

## 2022-08-18 DIAGNOSIS — D6869 Other thrombophilia: Secondary | ICD-10-CM | POA: Diagnosis not present

## 2022-08-18 DIAGNOSIS — Z139 Encounter for screening, unspecified: Secondary | ICD-10-CM | POA: Diagnosis not present

## 2022-08-18 DIAGNOSIS — Z86718 Personal history of other venous thrombosis and embolism: Secondary | ICD-10-CM | POA: Diagnosis not present

## 2022-08-31 DIAGNOSIS — R1310 Dysphagia, unspecified: Secondary | ICD-10-CM | POA: Diagnosis not present

## 2022-09-16 DIAGNOSIS — Z86718 Personal history of other venous thrombosis and embolism: Secondary | ICD-10-CM | POA: Diagnosis not present

## 2022-09-16 DIAGNOSIS — Z23 Encounter for immunization: Secondary | ICD-10-CM | POA: Diagnosis not present

## 2022-09-16 DIAGNOSIS — Z7901 Long term (current) use of anticoagulants: Secondary | ICD-10-CM | POA: Diagnosis not present

## 2022-09-16 DIAGNOSIS — Z6823 Body mass index (BMI) 23.0-23.9, adult: Secondary | ICD-10-CM | POA: Diagnosis not present

## 2022-09-16 DIAGNOSIS — D6869 Other thrombophilia: Secondary | ICD-10-CM | POA: Diagnosis not present

## 2022-10-18 DIAGNOSIS — Z139 Encounter for screening, unspecified: Secondary | ICD-10-CM | POA: Diagnosis not present

## 2022-10-18 DIAGNOSIS — R69 Illness, unspecified: Secondary | ICD-10-CM | POA: Diagnosis not present

## 2022-10-18 DIAGNOSIS — D6869 Other thrombophilia: Secondary | ICD-10-CM | POA: Diagnosis not present

## 2022-10-18 DIAGNOSIS — Z86718 Personal history of other venous thrombosis and embolism: Secondary | ICD-10-CM | POA: Diagnosis not present

## 2022-10-18 DIAGNOSIS — Z6823 Body mass index (BMI) 23.0-23.9, adult: Secondary | ICD-10-CM | POA: Diagnosis not present

## 2022-10-18 DIAGNOSIS — Z7901 Long term (current) use of anticoagulants: Secondary | ICD-10-CM | POA: Diagnosis not present

## 2022-10-26 DIAGNOSIS — Z7901 Long term (current) use of anticoagulants: Secondary | ICD-10-CM | POA: Diagnosis not present

## 2022-10-26 DIAGNOSIS — Z139 Encounter for screening, unspecified: Secondary | ICD-10-CM | POA: Diagnosis not present

## 2022-10-26 DIAGNOSIS — Z86718 Personal history of other venous thrombosis and embolism: Secondary | ICD-10-CM | POA: Diagnosis not present

## 2022-10-26 DIAGNOSIS — D6869 Other thrombophilia: Secondary | ICD-10-CM | POA: Diagnosis not present

## 2022-10-26 DIAGNOSIS — Z6823 Body mass index (BMI) 23.0-23.9, adult: Secondary | ICD-10-CM | POA: Diagnosis not present

## 2022-11-04 DIAGNOSIS — Z20822 Contact with and (suspected) exposure to covid-19: Secondary | ICD-10-CM | POA: Diagnosis not present

## 2022-11-04 DIAGNOSIS — J069 Acute upper respiratory infection, unspecified: Secondary | ICD-10-CM | POA: Diagnosis not present

## 2022-11-07 DIAGNOSIS — D6869 Other thrombophilia: Secondary | ICD-10-CM | POA: Diagnosis not present

## 2022-11-07 DIAGNOSIS — Z86718 Personal history of other venous thrombosis and embolism: Secondary | ICD-10-CM | POA: Diagnosis not present

## 2022-11-07 DIAGNOSIS — Z7901 Long term (current) use of anticoagulants: Secondary | ICD-10-CM | POA: Diagnosis not present

## 2022-11-07 DIAGNOSIS — Z6823 Body mass index (BMI) 23.0-23.9, adult: Secondary | ICD-10-CM | POA: Diagnosis not present

## 2022-11-17 DIAGNOSIS — E785 Hyperlipidemia, unspecified: Secondary | ICD-10-CM | POA: Diagnosis not present

## 2022-11-17 DIAGNOSIS — E039 Hypothyroidism, unspecified: Secondary | ICD-10-CM | POA: Diagnosis not present

## 2022-11-17 DIAGNOSIS — R7303 Prediabetes: Secondary | ICD-10-CM | POA: Diagnosis not present

## 2022-11-24 DIAGNOSIS — Z1389 Encounter for screening for other disorder: Secondary | ICD-10-CM | POA: Diagnosis not present

## 2022-11-24 DIAGNOSIS — R7303 Prediabetes: Secondary | ICD-10-CM | POA: Diagnosis not present

## 2022-11-24 DIAGNOSIS — Z1331 Encounter for screening for depression: Secondary | ICD-10-CM | POA: Diagnosis not present

## 2022-11-24 DIAGNOSIS — Z136 Encounter for screening for cardiovascular disorders: Secondary | ICD-10-CM | POA: Diagnosis not present

## 2022-11-24 DIAGNOSIS — D6869 Other thrombophilia: Secondary | ICD-10-CM | POA: Diagnosis not present

## 2022-11-24 DIAGNOSIS — Z7901 Long term (current) use of anticoagulants: Secondary | ICD-10-CM | POA: Diagnosis not present

## 2022-11-24 DIAGNOSIS — Z139 Encounter for screening, unspecified: Secondary | ICD-10-CM | POA: Diagnosis not present

## 2022-11-24 DIAGNOSIS — Z6824 Body mass index (BMI) 24.0-24.9, adult: Secondary | ICD-10-CM | POA: Diagnosis not present

## 2022-11-24 DIAGNOSIS — Z86718 Personal history of other venous thrombosis and embolism: Secondary | ICD-10-CM | POA: Diagnosis not present

## 2022-11-24 DIAGNOSIS — Z0000 Encounter for general adult medical examination without abnormal findings: Secondary | ICD-10-CM | POA: Diagnosis not present

## 2022-11-24 DIAGNOSIS — Z1339 Encounter for screening examination for other mental health and behavioral disorders: Secondary | ICD-10-CM | POA: Diagnosis not present

## 2022-12-07 DIAGNOSIS — Z6824 Body mass index (BMI) 24.0-24.9, adult: Secondary | ICD-10-CM | POA: Diagnosis not present

## 2022-12-07 DIAGNOSIS — Z789 Other specified health status: Secondary | ICD-10-CM | POA: Diagnosis not present

## 2022-12-07 DIAGNOSIS — D6869 Other thrombophilia: Secondary | ICD-10-CM | POA: Diagnosis not present

## 2022-12-07 DIAGNOSIS — H6992 Unspecified Eustachian tube disorder, left ear: Secondary | ICD-10-CM | POA: Diagnosis not present

## 2022-12-07 DIAGNOSIS — Z86718 Personal history of other venous thrombosis and embolism: Secondary | ICD-10-CM | POA: Diagnosis not present

## 2022-12-07 DIAGNOSIS — Z7901 Long term (current) use of anticoagulants: Secondary | ICD-10-CM | POA: Diagnosis not present

## 2022-12-13 DIAGNOSIS — G3184 Mild cognitive impairment, so stated: Secondary | ICD-10-CM | POA: Diagnosis not present

## 2022-12-13 DIAGNOSIS — Z6824 Body mass index (BMI) 24.0-24.9, adult: Secondary | ICD-10-CM | POA: Diagnosis not present

## 2022-12-14 DIAGNOSIS — Z5181 Encounter for therapeutic drug level monitoring: Secondary | ICD-10-CM | POA: Diagnosis not present

## 2022-12-14 DIAGNOSIS — D649 Anemia, unspecified: Secondary | ICD-10-CM | POA: Diagnosis not present

## 2022-12-14 DIAGNOSIS — Z86718 Personal history of other venous thrombosis and embolism: Secondary | ICD-10-CM | POA: Diagnosis not present

## 2022-12-14 DIAGNOSIS — Z6824 Body mass index (BMI) 24.0-24.9, adult: Secondary | ICD-10-CM | POA: Diagnosis not present

## 2022-12-14 DIAGNOSIS — Z7901 Long term (current) use of anticoagulants: Secondary | ICD-10-CM | POA: Diagnosis not present

## 2022-12-14 DIAGNOSIS — D6869 Other thrombophilia: Secondary | ICD-10-CM | POA: Diagnosis not present

## 2022-12-15 DIAGNOSIS — R600 Localized edema: Secondary | ICD-10-CM | POA: Diagnosis not present

## 2022-12-19 DIAGNOSIS — R69 Illness, unspecified: Secondary | ICD-10-CM | POA: Diagnosis not present

## 2022-12-19 DIAGNOSIS — E785 Hyperlipidemia, unspecified: Secondary | ICD-10-CM | POA: Diagnosis not present

## 2022-12-19 DIAGNOSIS — E039 Hypothyroidism, unspecified: Secondary | ICD-10-CM | POA: Diagnosis not present

## 2022-12-22 DIAGNOSIS — D6869 Other thrombophilia: Secondary | ICD-10-CM | POA: Diagnosis not present

## 2022-12-22 DIAGNOSIS — Z86718 Personal history of other venous thrombosis and embolism: Secondary | ICD-10-CM | POA: Diagnosis not present

## 2022-12-22 DIAGNOSIS — Z6823 Body mass index (BMI) 23.0-23.9, adult: Secondary | ICD-10-CM | POA: Diagnosis not present

## 2022-12-22 DIAGNOSIS — Z5181 Encounter for therapeutic drug level monitoring: Secondary | ICD-10-CM | POA: Diagnosis not present

## 2022-12-22 DIAGNOSIS — Z7901 Long term (current) use of anticoagulants: Secondary | ICD-10-CM | POA: Diagnosis not present

## 2023-01-02 DIAGNOSIS — Z6823 Body mass index (BMI) 23.0-23.9, adult: Secondary | ICD-10-CM | POA: Diagnosis not present

## 2023-01-02 DIAGNOSIS — N390 Urinary tract infection, site not specified: Secondary | ICD-10-CM | POA: Diagnosis not present

## 2023-01-02 DIAGNOSIS — R300 Dysuria: Secondary | ICD-10-CM | POA: Diagnosis not present

## 2023-01-02 DIAGNOSIS — R319 Hematuria, unspecified: Secondary | ICD-10-CM | POA: Diagnosis not present

## 2023-01-05 DIAGNOSIS — R69 Illness, unspecified: Secondary | ICD-10-CM | POA: Diagnosis not present

## 2023-01-05 DIAGNOSIS — Z6824 Body mass index (BMI) 24.0-24.9, adult: Secondary | ICD-10-CM | POA: Diagnosis not present

## 2023-01-05 DIAGNOSIS — G309 Alzheimer's disease, unspecified: Secondary | ICD-10-CM | POA: Diagnosis not present

## 2023-01-18 DIAGNOSIS — E039 Hypothyroidism, unspecified: Secondary | ICD-10-CM | POA: Diagnosis not present

## 2023-01-18 DIAGNOSIS — G309 Alzheimer's disease, unspecified: Secondary | ICD-10-CM | POA: Diagnosis not present

## 2023-01-18 DIAGNOSIS — E785 Hyperlipidemia, unspecified: Secondary | ICD-10-CM | POA: Diagnosis not present

## 2023-01-24 DIAGNOSIS — R1032 Left lower quadrant pain: Secondary | ICD-10-CM | POA: Diagnosis not present

## 2023-01-24 DIAGNOSIS — D6869 Other thrombophilia: Secondary | ICD-10-CM | POA: Diagnosis not present

## 2023-01-24 DIAGNOSIS — Z86718 Personal history of other venous thrombosis and embolism: Secondary | ICD-10-CM | POA: Diagnosis not present

## 2023-01-24 DIAGNOSIS — Z7901 Long term (current) use of anticoagulants: Secondary | ICD-10-CM | POA: Diagnosis not present

## 2023-01-24 DIAGNOSIS — Z6824 Body mass index (BMI) 24.0-24.9, adult: Secondary | ICD-10-CM | POA: Diagnosis not present

## 2023-01-30 DIAGNOSIS — R1032 Left lower quadrant pain: Secondary | ICD-10-CM | POA: Diagnosis not present

## 2023-01-30 DIAGNOSIS — Z87442 Personal history of urinary calculi: Secondary | ICD-10-CM | POA: Diagnosis not present

## 2023-02-02 DIAGNOSIS — S70369A Insect bite (nonvenomous), unspecified thigh, initial encounter: Secondary | ICD-10-CM | POA: Diagnosis not present

## 2023-02-02 DIAGNOSIS — Z6823 Body mass index (BMI) 23.0-23.9, adult: Secondary | ICD-10-CM | POA: Diagnosis not present

## 2023-02-02 DIAGNOSIS — W57XXXA Bitten or stung by nonvenomous insect and other nonvenomous arthropods, initial encounter: Secondary | ICD-10-CM | POA: Diagnosis not present

## 2023-02-02 DIAGNOSIS — Z7901 Long term (current) use of anticoagulants: Secondary | ICD-10-CM | POA: Diagnosis not present

## 2023-02-02 DIAGNOSIS — Z86718 Personal history of other venous thrombosis and embolism: Secondary | ICD-10-CM | POA: Diagnosis not present

## 2023-02-09 DIAGNOSIS — R102 Pelvic and perineal pain: Secondary | ICD-10-CM | POA: Diagnosis not present

## 2023-02-09 DIAGNOSIS — Z86718 Personal history of other venous thrombosis and embolism: Secondary | ICD-10-CM | POA: Diagnosis not present

## 2023-02-09 DIAGNOSIS — Z7901 Long term (current) use of anticoagulants: Secondary | ICD-10-CM | POA: Diagnosis not present

## 2023-02-09 DIAGNOSIS — Z6824 Body mass index (BMI) 24.0-24.9, adult: Secondary | ICD-10-CM | POA: Diagnosis not present

## 2023-02-09 DIAGNOSIS — D6869 Other thrombophilia: Secondary | ICD-10-CM | POA: Diagnosis not present

## 2023-02-13 DIAGNOSIS — N3001 Acute cystitis with hematuria: Secondary | ICD-10-CM | POA: Diagnosis not present

## 2023-02-13 DIAGNOSIS — N3091 Cystitis, unspecified with hematuria: Secondary | ICD-10-CM | POA: Diagnosis not present

## 2023-02-13 DIAGNOSIS — R509 Fever, unspecified: Secondary | ICD-10-CM | POA: Diagnosis not present

## 2023-02-13 DIAGNOSIS — R300 Dysuria: Secondary | ICD-10-CM | POA: Diagnosis not present

## 2023-02-14 DIAGNOSIS — N10 Acute tubulo-interstitial nephritis: Secondary | ICD-10-CM | POA: Diagnosis not present

## 2023-02-14 DIAGNOSIS — Z6824 Body mass index (BMI) 24.0-24.9, adult: Secondary | ICD-10-CM | POA: Diagnosis not present

## 2023-02-16 DIAGNOSIS — D6869 Other thrombophilia: Secondary | ICD-10-CM | POA: Diagnosis not present

## 2023-02-16 DIAGNOSIS — Z7901 Long term (current) use of anticoagulants: Secondary | ICD-10-CM | POA: Diagnosis not present

## 2023-02-16 DIAGNOSIS — Z6823 Body mass index (BMI) 23.0-23.9, adult: Secondary | ICD-10-CM | POA: Diagnosis not present

## 2023-02-16 DIAGNOSIS — Z86718 Personal history of other venous thrombosis and embolism: Secondary | ICD-10-CM | POA: Diagnosis not present

## 2023-02-17 DIAGNOSIS — N952 Postmenopausal atrophic vaginitis: Secondary | ICD-10-CM | POA: Diagnosis not present

## 2023-02-17 DIAGNOSIS — Z87442 Personal history of urinary calculi: Secondary | ICD-10-CM | POA: Diagnosis not present

## 2023-02-17 DIAGNOSIS — Z79899 Other long term (current) drug therapy: Secondary | ICD-10-CM | POA: Diagnosis not present

## 2023-02-17 DIAGNOSIS — R109 Unspecified abdominal pain: Secondary | ICD-10-CM | POA: Diagnosis not present

## 2023-02-17 DIAGNOSIS — R102 Pelvic and perineal pain: Secondary | ICD-10-CM | POA: Diagnosis not present

## 2023-02-18 DIAGNOSIS — G309 Alzheimer's disease, unspecified: Secondary | ICD-10-CM | POA: Diagnosis not present

## 2023-02-18 DIAGNOSIS — D6869 Other thrombophilia: Secondary | ICD-10-CM | POA: Diagnosis not present

## 2023-02-18 DIAGNOSIS — E039 Hypothyroidism, unspecified: Secondary | ICD-10-CM | POA: Diagnosis not present

## 2023-02-21 DIAGNOSIS — Z86718 Personal history of other venous thrombosis and embolism: Secondary | ICD-10-CM | POA: Diagnosis not present

## 2023-02-21 DIAGNOSIS — D6869 Other thrombophilia: Secondary | ICD-10-CM | POA: Diagnosis not present

## 2023-02-21 DIAGNOSIS — Z7901 Long term (current) use of anticoagulants: Secondary | ICD-10-CM | POA: Diagnosis not present

## 2023-02-21 DIAGNOSIS — Z6823 Body mass index (BMI) 23.0-23.9, adult: Secondary | ICD-10-CM | POA: Diagnosis not present

## 2023-02-24 DIAGNOSIS — N390 Urinary tract infection, site not specified: Secondary | ICD-10-CM | POA: Diagnosis not present

## 2023-02-28 DIAGNOSIS — Z6823 Body mass index (BMI) 23.0-23.9, adult: Secondary | ICD-10-CM | POA: Diagnosis not present

## 2023-02-28 DIAGNOSIS — Z86718 Personal history of other venous thrombosis and embolism: Secondary | ICD-10-CM | POA: Diagnosis not present

## 2023-02-28 DIAGNOSIS — D6869 Other thrombophilia: Secondary | ICD-10-CM | POA: Diagnosis not present

## 2023-02-28 DIAGNOSIS — Z7901 Long term (current) use of anticoagulants: Secondary | ICD-10-CM | POA: Diagnosis not present

## 2023-03-14 DIAGNOSIS — Z87442 Personal history of urinary calculi: Secondary | ICD-10-CM | POA: Diagnosis not present

## 2023-03-14 DIAGNOSIS — N952 Postmenopausal atrophic vaginitis: Secondary | ICD-10-CM | POA: Diagnosis not present

## 2023-03-14 DIAGNOSIS — N390 Urinary tract infection, site not specified: Secondary | ICD-10-CM | POA: Diagnosis not present

## 2023-03-14 DIAGNOSIS — R102 Pelvic and perineal pain: Secondary | ICD-10-CM | POA: Diagnosis not present

## 2023-03-17 DIAGNOSIS — Z7901 Long term (current) use of anticoagulants: Secondary | ICD-10-CM | POA: Diagnosis not present

## 2023-03-17 DIAGNOSIS — N3010 Interstitial cystitis (chronic) without hematuria: Secondary | ICD-10-CM | POA: Diagnosis not present

## 2023-03-17 DIAGNOSIS — Z86718 Personal history of other venous thrombosis and embolism: Secondary | ICD-10-CM | POA: Diagnosis not present

## 2023-03-17 DIAGNOSIS — D6869 Other thrombophilia: Secondary | ICD-10-CM | POA: Diagnosis not present

## 2023-03-20 DIAGNOSIS — G309 Alzheimer's disease, unspecified: Secondary | ICD-10-CM | POA: Diagnosis not present

## 2023-03-20 DIAGNOSIS — D6869 Other thrombophilia: Secondary | ICD-10-CM | POA: Diagnosis not present

## 2023-03-20 DIAGNOSIS — E039 Hypothyroidism, unspecified: Secondary | ICD-10-CM | POA: Diagnosis not present

## 2023-03-27 DIAGNOSIS — Z86718 Personal history of other venous thrombosis and embolism: Secondary | ICD-10-CM | POA: Diagnosis not present

## 2023-03-27 DIAGNOSIS — Z6823 Body mass index (BMI) 23.0-23.9, adult: Secondary | ICD-10-CM | POA: Diagnosis not present

## 2023-03-27 DIAGNOSIS — Z5181 Encounter for therapeutic drug level monitoring: Secondary | ICD-10-CM | POA: Diagnosis not present

## 2023-03-27 DIAGNOSIS — Z7901 Long term (current) use of anticoagulants: Secondary | ICD-10-CM | POA: Diagnosis not present

## 2023-03-27 DIAGNOSIS — D6869 Other thrombophilia: Secondary | ICD-10-CM | POA: Diagnosis not present

## 2023-04-11 DIAGNOSIS — Z5181 Encounter for therapeutic drug level monitoring: Secondary | ICD-10-CM | POA: Diagnosis not present

## 2023-04-11 DIAGNOSIS — R030 Elevated blood-pressure reading, without diagnosis of hypertension: Secondary | ICD-10-CM | POA: Diagnosis not present

## 2023-04-11 DIAGNOSIS — Z7901 Long term (current) use of anticoagulants: Secondary | ICD-10-CM | POA: Diagnosis not present

## 2023-04-11 DIAGNOSIS — Z6823 Body mass index (BMI) 23.0-23.9, adult: Secondary | ICD-10-CM | POA: Diagnosis not present

## 2023-04-11 DIAGNOSIS — Z86718 Personal history of other venous thrombosis and embolism: Secondary | ICD-10-CM | POA: Diagnosis not present

## 2023-04-20 DIAGNOSIS — D6869 Other thrombophilia: Secondary | ICD-10-CM | POA: Diagnosis not present

## 2023-04-20 DIAGNOSIS — E039 Hypothyroidism, unspecified: Secondary | ICD-10-CM | POA: Diagnosis not present

## 2023-04-20 DIAGNOSIS — G309 Alzheimer's disease, unspecified: Secondary | ICD-10-CM | POA: Diagnosis not present

## 2023-05-03 DIAGNOSIS — R634 Abnormal weight loss: Secondary | ICD-10-CM | POA: Diagnosis not present

## 2023-05-03 DIAGNOSIS — Z6823 Body mass index (BMI) 23.0-23.9, adult: Secondary | ICD-10-CM | POA: Diagnosis not present

## 2023-05-03 DIAGNOSIS — Z5181 Encounter for therapeutic drug level monitoring: Secondary | ICD-10-CM | POA: Diagnosis not present

## 2023-05-03 DIAGNOSIS — Z7901 Long term (current) use of anticoagulants: Secondary | ICD-10-CM | POA: Diagnosis not present

## 2023-05-03 DIAGNOSIS — E039 Hypothyroidism, unspecified: Secondary | ICD-10-CM | POA: Diagnosis not present

## 2023-05-03 DIAGNOSIS — Z86718 Personal history of other venous thrombosis and embolism: Secondary | ICD-10-CM | POA: Diagnosis not present

## 2023-05-17 DIAGNOSIS — D72829 Elevated white blood cell count, unspecified: Secondary | ICD-10-CM | POA: Diagnosis not present

## 2023-05-17 DIAGNOSIS — Z6823 Body mass index (BMI) 23.0-23.9, adult: Secondary | ICD-10-CM | POA: Diagnosis not present

## 2023-05-17 DIAGNOSIS — Z86718 Personal history of other venous thrombosis and embolism: Secondary | ICD-10-CM | POA: Diagnosis not present

## 2023-05-17 DIAGNOSIS — Z7901 Long term (current) use of anticoagulants: Secondary | ICD-10-CM | POA: Diagnosis not present

## 2023-05-17 DIAGNOSIS — Z23 Encounter for immunization: Secondary | ICD-10-CM | POA: Diagnosis not present

## 2023-05-17 DIAGNOSIS — E785 Hyperlipidemia, unspecified: Secondary | ICD-10-CM | POA: Diagnosis not present

## 2023-05-17 DIAGNOSIS — E039 Hypothyroidism, unspecified: Secondary | ICD-10-CM | POA: Diagnosis not present

## 2023-05-17 DIAGNOSIS — R7303 Prediabetes: Secondary | ICD-10-CM | POA: Diagnosis not present

## 2023-05-17 DIAGNOSIS — R030 Elevated blood-pressure reading, without diagnosis of hypertension: Secondary | ICD-10-CM | POA: Diagnosis not present

## 2023-05-19 DIAGNOSIS — M25562 Pain in left knee: Secondary | ICD-10-CM | POA: Diagnosis not present

## 2023-05-19 DIAGNOSIS — S72142A Displaced intertrochanteric fracture of left femur, initial encounter for closed fracture: Secondary | ICD-10-CM | POA: Diagnosis not present

## 2023-05-19 DIAGNOSIS — M79652 Pain in left thigh: Secondary | ICD-10-CM | POA: Diagnosis not present

## 2023-05-19 DIAGNOSIS — M810 Age-related osteoporosis without current pathological fracture: Secondary | ICD-10-CM | POA: Diagnosis not present

## 2023-05-19 DIAGNOSIS — Z743 Need for continuous supervision: Secondary | ICD-10-CM | POA: Diagnosis not present

## 2023-05-19 DIAGNOSIS — K449 Diaphragmatic hernia without obstruction or gangrene: Secondary | ICD-10-CM | POA: Diagnosis not present

## 2023-05-19 DIAGNOSIS — M85852 Other specified disorders of bone density and structure, left thigh: Secondary | ICD-10-CM | POA: Diagnosis not present

## 2023-05-19 DIAGNOSIS — W19XXXA Unspecified fall, initial encounter: Secondary | ICD-10-CM | POA: Diagnosis not present

## 2023-05-19 DIAGNOSIS — Z043 Encounter for examination and observation following other accident: Secondary | ICD-10-CM | POA: Diagnosis not present

## 2023-05-19 DIAGNOSIS — R9431 Abnormal electrocardiogram [ECG] [EKG]: Secondary | ICD-10-CM | POA: Diagnosis not present

## 2023-05-19 DIAGNOSIS — I700 Atherosclerosis of aorta: Secondary | ICD-10-CM | POA: Diagnosis not present

## 2023-05-19 DIAGNOSIS — M25552 Pain in left hip: Secondary | ICD-10-CM | POA: Diagnosis not present

## 2023-05-19 DIAGNOSIS — S80919A Unspecified superficial injury of unspecified knee, initial encounter: Secondary | ICD-10-CM | POA: Diagnosis not present

## 2023-05-20 DIAGNOSIS — M25562 Pain in left knee: Secondary | ICD-10-CM | POA: Diagnosis not present

## 2023-05-20 DIAGNOSIS — Z87442 Personal history of urinary calculi: Secondary | ICD-10-CM | POA: Diagnosis not present

## 2023-05-20 DIAGNOSIS — S72142A Displaced intertrochanteric fracture of left femur, initial encounter for closed fracture: Secondary | ICD-10-CM | POA: Diagnosis not present

## 2023-05-20 DIAGNOSIS — Z7901 Long term (current) use of anticoagulants: Secondary | ICD-10-CM | POA: Diagnosis not present

## 2023-05-20 DIAGNOSIS — F32A Depression, unspecified: Secondary | ICD-10-CM | POA: Diagnosis not present

## 2023-05-20 DIAGNOSIS — Z87891 Personal history of nicotine dependence: Secondary | ICD-10-CM | POA: Diagnosis not present

## 2023-05-20 DIAGNOSIS — F419 Anxiety disorder, unspecified: Secondary | ICD-10-CM | POA: Diagnosis not present

## 2023-05-20 DIAGNOSIS — E876 Hypokalemia: Secondary | ICD-10-CM | POA: Diagnosis not present

## 2023-05-20 DIAGNOSIS — M810 Age-related osteoporosis without current pathological fracture: Secondary | ICD-10-CM | POA: Diagnosis not present

## 2023-05-20 DIAGNOSIS — Z87440 Personal history of urinary (tract) infections: Secondary | ICD-10-CM | POA: Diagnosis not present

## 2023-05-20 DIAGNOSIS — M79652 Pain in left thigh: Secondary | ICD-10-CM | POA: Diagnosis not present

## 2023-05-20 DIAGNOSIS — R339 Retention of urine, unspecified: Secondary | ICD-10-CM | POA: Diagnosis not present

## 2023-05-20 DIAGNOSIS — M069 Rheumatoid arthritis, unspecified: Secondary | ICD-10-CM | POA: Diagnosis not present

## 2023-05-20 DIAGNOSIS — R9431 Abnormal electrocardiogram [ECG] [EKG]: Secondary | ICD-10-CM | POA: Diagnosis not present

## 2023-05-20 DIAGNOSIS — I10 Essential (primary) hypertension: Secondary | ICD-10-CM | POA: Diagnosis not present

## 2023-05-20 DIAGNOSIS — Z79899 Other long term (current) drug therapy: Secondary | ICD-10-CM | POA: Diagnosis not present

## 2023-05-20 DIAGNOSIS — D6869 Other thrombophilia: Secondary | ICD-10-CM | POA: Diagnosis not present

## 2023-05-20 DIAGNOSIS — W010XXA Fall on same level from slipping, tripping and stumbling without subsequent striking against object, initial encounter: Secondary | ICD-10-CM | POA: Diagnosis not present

## 2023-05-20 DIAGNOSIS — E7800 Pure hypercholesterolemia, unspecified: Secondary | ICD-10-CM | POA: Diagnosis not present

## 2023-05-20 DIAGNOSIS — M1990 Unspecified osteoarthritis, unspecified site: Secondary | ICD-10-CM | POA: Diagnosis not present

## 2023-05-20 DIAGNOSIS — M85852 Other specified disorders of bone density and structure, left thigh: Secondary | ICD-10-CM | POA: Diagnosis not present

## 2023-05-20 DIAGNOSIS — K219 Gastro-esophageal reflux disease without esophagitis: Secondary | ICD-10-CM | POA: Diagnosis not present

## 2023-05-20 DIAGNOSIS — E039 Hypothyroidism, unspecified: Secondary | ICD-10-CM | POA: Diagnosis not present

## 2023-05-20 DIAGNOSIS — M25552 Pain in left hip: Secondary | ICD-10-CM | POA: Diagnosis not present

## 2023-05-20 DIAGNOSIS — K449 Diaphragmatic hernia without obstruction or gangrene: Secondary | ICD-10-CM | POA: Diagnosis not present

## 2023-05-20 DIAGNOSIS — Z86718 Personal history of other venous thrombosis and embolism: Secondary | ICD-10-CM | POA: Diagnosis not present

## 2023-05-20 DIAGNOSIS — W19XXXA Unspecified fall, initial encounter: Secondary | ICD-10-CM | POA: Diagnosis not present

## 2023-05-20 DIAGNOSIS — J449 Chronic obstructive pulmonary disease, unspecified: Secondary | ICD-10-CM | POA: Diagnosis not present

## 2023-05-20 DIAGNOSIS — D649 Anemia, unspecified: Secondary | ICD-10-CM | POA: Diagnosis not present

## 2023-05-20 DIAGNOSIS — Z888 Allergy status to other drugs, medicaments and biological substances status: Secondary | ICD-10-CM | POA: Diagnosis not present

## 2023-05-20 DIAGNOSIS — I700 Atherosclerosis of aorta: Secondary | ICD-10-CM | POA: Diagnosis not present

## 2023-05-20 DIAGNOSIS — Z043 Encounter for examination and observation following other accident: Secondary | ICD-10-CM | POA: Diagnosis not present

## 2023-05-20 DIAGNOSIS — G309 Alzheimer's disease, unspecified: Secondary | ICD-10-CM | POA: Diagnosis not present

## 2023-05-21 DIAGNOSIS — D6869 Other thrombophilia: Secondary | ICD-10-CM | POA: Diagnosis not present

## 2023-05-21 DIAGNOSIS — G309 Alzheimer's disease, unspecified: Secondary | ICD-10-CM | POA: Diagnosis not present

## 2023-05-21 DIAGNOSIS — E039 Hypothyroidism, unspecified: Secondary | ICD-10-CM | POA: Diagnosis not present

## 2023-05-21 HISTORY — PX: HIP FRACTURE SURGERY: SHX118

## 2023-05-25 DIAGNOSIS — R339 Retention of urine, unspecified: Secondary | ICD-10-CM | POA: Diagnosis not present

## 2023-05-25 DIAGNOSIS — D62 Acute posthemorrhagic anemia: Secondary | ICD-10-CM | POA: Diagnosis not present

## 2023-05-25 DIAGNOSIS — Z7901 Long term (current) use of anticoagulants: Secondary | ICD-10-CM | POA: Diagnosis not present

## 2023-05-25 DIAGNOSIS — R54 Age-related physical debility: Secondary | ICD-10-CM | POA: Diagnosis not present

## 2023-05-25 DIAGNOSIS — N9989 Other postprocedural complications and disorders of genitourinary system: Secondary | ICD-10-CM | POA: Diagnosis not present

## 2023-05-25 DIAGNOSIS — R2681 Unsteadiness on feet: Secondary | ICD-10-CM | POA: Diagnosis not present

## 2023-05-25 DIAGNOSIS — R531 Weakness: Secondary | ICD-10-CM | POA: Diagnosis not present

## 2023-05-25 DIAGNOSIS — Z743 Need for continuous supervision: Secondary | ICD-10-CM | POA: Diagnosis not present

## 2023-05-25 DIAGNOSIS — M810 Age-related osteoporosis without current pathological fracture: Secondary | ICD-10-CM | POA: Diagnosis not present

## 2023-05-25 DIAGNOSIS — E876 Hypokalemia: Secondary | ICD-10-CM | POA: Diagnosis not present

## 2023-05-25 DIAGNOSIS — F419 Anxiety disorder, unspecified: Secondary | ICD-10-CM | POA: Diagnosis not present

## 2023-05-25 DIAGNOSIS — G8918 Other acute postprocedural pain: Secondary | ICD-10-CM | POA: Diagnosis not present

## 2023-05-25 DIAGNOSIS — S72142A Displaced intertrochanteric fracture of left femur, initial encounter for closed fracture: Secondary | ICD-10-CM | POA: Diagnosis not present

## 2023-05-25 DIAGNOSIS — M62562 Muscle wasting and atrophy, not elsewhere classified, left lower leg: Secondary | ICD-10-CM | POA: Diagnosis not present

## 2023-05-25 DIAGNOSIS — R2689 Other abnormalities of gait and mobility: Secondary | ICD-10-CM | POA: Diagnosis not present

## 2023-05-25 DIAGNOSIS — S72002D Fracture of unspecified part of neck of left femur, subsequent encounter for closed fracture with routine healing: Secondary | ICD-10-CM | POA: Diagnosis not present

## 2023-05-25 DIAGNOSIS — S72142D Displaced intertrochanteric fracture of left femur, subsequent encounter for closed fracture with routine healing: Secondary | ICD-10-CM | POA: Diagnosis not present

## 2023-05-25 DIAGNOSIS — I10 Essential (primary) hypertension: Secondary | ICD-10-CM | POA: Diagnosis not present

## 2023-05-25 DIAGNOSIS — Z7401 Bed confinement status: Secondary | ICD-10-CM | POA: Diagnosis not present

## 2023-05-25 DIAGNOSIS — W19XXXD Unspecified fall, subsequent encounter: Secondary | ICD-10-CM | POA: Diagnosis not present

## 2023-05-29 DIAGNOSIS — M810 Age-related osteoporosis without current pathological fracture: Secondary | ICD-10-CM | POA: Diagnosis not present

## 2023-05-29 DIAGNOSIS — D62 Acute posthemorrhagic anemia: Secondary | ICD-10-CM | POA: Diagnosis not present

## 2023-05-29 DIAGNOSIS — G8918 Other acute postprocedural pain: Secondary | ICD-10-CM | POA: Diagnosis not present

## 2023-05-29 DIAGNOSIS — S72002D Fracture of unspecified part of neck of left femur, subsequent encounter for closed fracture with routine healing: Secondary | ICD-10-CM | POA: Diagnosis not present

## 2023-06-09 DIAGNOSIS — S72142D Displaced intertrochanteric fracture of left femur, subsequent encounter for closed fracture with routine healing: Secondary | ICD-10-CM | POA: Diagnosis not present

## 2023-06-12 ENCOUNTER — Encounter: Payer: Self-pay | Admitting: Internal Medicine

## 2023-06-14 DIAGNOSIS — E785 Hyperlipidemia, unspecified: Secondary | ICD-10-CM | POA: Diagnosis not present

## 2023-06-14 DIAGNOSIS — Z9181 History of falling: Secondary | ICD-10-CM | POA: Diagnosis not present

## 2023-06-14 DIAGNOSIS — Z7901 Long term (current) use of anticoagulants: Secondary | ICD-10-CM | POA: Diagnosis not present

## 2023-06-14 DIAGNOSIS — D62 Acute posthemorrhagic anemia: Secondary | ICD-10-CM | POA: Diagnosis not present

## 2023-06-14 DIAGNOSIS — F02A3 Dementia in other diseases classified elsewhere, mild, with mood disturbance: Secondary | ICD-10-CM | POA: Diagnosis not present

## 2023-06-14 DIAGNOSIS — N9989 Other postprocedural complications and disorders of genitourinary system: Secondary | ICD-10-CM | POA: Diagnosis not present

## 2023-06-14 DIAGNOSIS — Z471 Aftercare following joint replacement surgery: Secondary | ICD-10-CM | POA: Diagnosis not present

## 2023-06-14 DIAGNOSIS — J449 Chronic obstructive pulmonary disease, unspecified: Secondary | ICD-10-CM | POA: Diagnosis not present

## 2023-06-14 DIAGNOSIS — R7303 Prediabetes: Secondary | ICD-10-CM | POA: Diagnosis not present

## 2023-06-14 DIAGNOSIS — Z86718 Personal history of other venous thrombosis and embolism: Secondary | ICD-10-CM | POA: Diagnosis not present

## 2023-06-14 DIAGNOSIS — E039 Hypothyroidism, unspecified: Secondary | ICD-10-CM | POA: Diagnosis not present

## 2023-06-14 DIAGNOSIS — I10 Essential (primary) hypertension: Secondary | ICD-10-CM | POA: Diagnosis not present

## 2023-06-14 DIAGNOSIS — F339 Major depressive disorder, recurrent, unspecified: Secondary | ICD-10-CM | POA: Diagnosis not present

## 2023-06-14 DIAGNOSIS — K219 Gastro-esophageal reflux disease without esophagitis: Secondary | ICD-10-CM | POA: Diagnosis not present

## 2023-06-14 DIAGNOSIS — I739 Peripheral vascular disease, unspecified: Secondary | ICD-10-CM | POA: Diagnosis not present

## 2023-06-14 DIAGNOSIS — G8929 Other chronic pain: Secondary | ICD-10-CM | POA: Diagnosis not present

## 2023-06-14 DIAGNOSIS — Z79899 Other long term (current) drug therapy: Secondary | ICD-10-CM | POA: Diagnosis not present

## 2023-06-14 DIAGNOSIS — E876 Hypokalemia: Secondary | ICD-10-CM | POA: Diagnosis not present

## 2023-06-14 DIAGNOSIS — D6869 Other thrombophilia: Secondary | ICD-10-CM | POA: Diagnosis not present

## 2023-06-14 DIAGNOSIS — F02A4 Dementia in other diseases classified elsewhere, mild, with anxiety: Secondary | ICD-10-CM | POA: Diagnosis not present

## 2023-06-14 DIAGNOSIS — J309 Allergic rhinitis, unspecified: Secondary | ICD-10-CM | POA: Diagnosis not present

## 2023-06-14 DIAGNOSIS — G309 Alzheimer's disease, unspecified: Secondary | ICD-10-CM | POA: Diagnosis not present

## 2023-06-14 DIAGNOSIS — Z87891 Personal history of nicotine dependence: Secondary | ICD-10-CM | POA: Diagnosis not present

## 2023-06-14 DIAGNOSIS — R338 Other retention of urine: Secondary | ICD-10-CM | POA: Diagnosis not present

## 2023-06-14 DIAGNOSIS — M80052D Age-related osteoporosis with current pathological fracture, left femur, subsequent encounter for fracture with routine healing: Secondary | ICD-10-CM | POA: Diagnosis not present

## 2023-06-14 DIAGNOSIS — M069 Rheumatoid arthritis, unspecified: Secondary | ICD-10-CM | POA: Diagnosis not present

## 2023-06-16 DIAGNOSIS — G309 Alzheimer's disease, unspecified: Secondary | ICD-10-CM | POA: Diagnosis not present

## 2023-06-16 DIAGNOSIS — F02A3 Dementia in other diseases classified elsewhere, mild, with mood disturbance: Secondary | ICD-10-CM | POA: Diagnosis not present

## 2023-06-16 DIAGNOSIS — E785 Hyperlipidemia, unspecified: Secondary | ICD-10-CM | POA: Diagnosis not present

## 2023-06-16 DIAGNOSIS — E876 Hypokalemia: Secondary | ICD-10-CM | POA: Diagnosis not present

## 2023-06-16 DIAGNOSIS — F339 Major depressive disorder, recurrent, unspecified: Secondary | ICD-10-CM | POA: Diagnosis not present

## 2023-06-16 DIAGNOSIS — K219 Gastro-esophageal reflux disease without esophagitis: Secondary | ICD-10-CM | POA: Diagnosis not present

## 2023-06-16 DIAGNOSIS — R7303 Prediabetes: Secondary | ICD-10-CM | POA: Diagnosis not present

## 2023-06-16 DIAGNOSIS — Z7901 Long term (current) use of anticoagulants: Secondary | ICD-10-CM | POA: Diagnosis not present

## 2023-06-16 DIAGNOSIS — I10 Essential (primary) hypertension: Secondary | ICD-10-CM | POA: Diagnosis not present

## 2023-06-16 DIAGNOSIS — M069 Rheumatoid arthritis, unspecified: Secondary | ICD-10-CM | POA: Diagnosis not present

## 2023-06-16 DIAGNOSIS — D6869 Other thrombophilia: Secondary | ICD-10-CM | POA: Diagnosis not present

## 2023-06-16 DIAGNOSIS — Z79899 Other long term (current) drug therapy: Secondary | ICD-10-CM | POA: Diagnosis not present

## 2023-06-16 DIAGNOSIS — Z86718 Personal history of other venous thrombosis and embolism: Secondary | ICD-10-CM | POA: Diagnosis not present

## 2023-06-16 DIAGNOSIS — I739 Peripheral vascular disease, unspecified: Secondary | ICD-10-CM | POA: Diagnosis not present

## 2023-06-16 DIAGNOSIS — D62 Acute posthemorrhagic anemia: Secondary | ICD-10-CM | POA: Diagnosis not present

## 2023-06-16 DIAGNOSIS — M80052D Age-related osteoporosis with current pathological fracture, left femur, subsequent encounter for fracture with routine healing: Secondary | ICD-10-CM | POA: Diagnosis not present

## 2023-06-16 DIAGNOSIS — E039 Hypothyroidism, unspecified: Secondary | ICD-10-CM | POA: Diagnosis not present

## 2023-06-16 DIAGNOSIS — R338 Other retention of urine: Secondary | ICD-10-CM | POA: Diagnosis not present

## 2023-06-16 DIAGNOSIS — Z9181 History of falling: Secondary | ICD-10-CM | POA: Diagnosis not present

## 2023-06-16 DIAGNOSIS — Z87891 Personal history of nicotine dependence: Secondary | ICD-10-CM | POA: Diagnosis not present

## 2023-06-16 DIAGNOSIS — J309 Allergic rhinitis, unspecified: Secondary | ICD-10-CM | POA: Diagnosis not present

## 2023-06-16 DIAGNOSIS — F02A4 Dementia in other diseases classified elsewhere, mild, with anxiety: Secondary | ICD-10-CM | POA: Diagnosis not present

## 2023-06-16 DIAGNOSIS — G8929 Other chronic pain: Secondary | ICD-10-CM | POA: Diagnosis not present

## 2023-06-16 DIAGNOSIS — N9989 Other postprocedural complications and disorders of genitourinary system: Secondary | ICD-10-CM | POA: Diagnosis not present

## 2023-06-16 DIAGNOSIS — J449 Chronic obstructive pulmonary disease, unspecified: Secondary | ICD-10-CM | POA: Diagnosis not present

## 2023-06-19 DIAGNOSIS — R338 Other retention of urine: Secondary | ICD-10-CM | POA: Diagnosis not present

## 2023-06-19 DIAGNOSIS — M069 Rheumatoid arthritis, unspecified: Secondary | ICD-10-CM | POA: Diagnosis not present

## 2023-06-19 DIAGNOSIS — I739 Peripheral vascular disease, unspecified: Secondary | ICD-10-CM | POA: Diagnosis not present

## 2023-06-19 DIAGNOSIS — F02A3 Dementia in other diseases classified elsewhere, mild, with mood disturbance: Secondary | ICD-10-CM | POA: Diagnosis not present

## 2023-06-19 DIAGNOSIS — Z79899 Other long term (current) drug therapy: Secondary | ICD-10-CM | POA: Diagnosis not present

## 2023-06-19 DIAGNOSIS — G8929 Other chronic pain: Secondary | ICD-10-CM | POA: Diagnosis not present

## 2023-06-19 DIAGNOSIS — Z7901 Long term (current) use of anticoagulants: Secondary | ICD-10-CM | POA: Diagnosis not present

## 2023-06-19 DIAGNOSIS — E039 Hypothyroidism, unspecified: Secondary | ICD-10-CM | POA: Diagnosis not present

## 2023-06-19 DIAGNOSIS — J449 Chronic obstructive pulmonary disease, unspecified: Secondary | ICD-10-CM | POA: Diagnosis not present

## 2023-06-19 DIAGNOSIS — E876 Hypokalemia: Secondary | ICD-10-CM | POA: Diagnosis not present

## 2023-06-19 DIAGNOSIS — Z86718 Personal history of other venous thrombosis and embolism: Secondary | ICD-10-CM | POA: Diagnosis not present

## 2023-06-19 DIAGNOSIS — F339 Major depressive disorder, recurrent, unspecified: Secondary | ICD-10-CM | POA: Diagnosis not present

## 2023-06-19 DIAGNOSIS — K219 Gastro-esophageal reflux disease without esophagitis: Secondary | ICD-10-CM | POA: Diagnosis not present

## 2023-06-19 DIAGNOSIS — D62 Acute posthemorrhagic anemia: Secondary | ICD-10-CM | POA: Diagnosis not present

## 2023-06-19 DIAGNOSIS — I10 Essential (primary) hypertension: Secondary | ICD-10-CM | POA: Diagnosis not present

## 2023-06-19 DIAGNOSIS — D6869 Other thrombophilia: Secondary | ICD-10-CM | POA: Diagnosis not present

## 2023-06-19 DIAGNOSIS — Z87891 Personal history of nicotine dependence: Secondary | ICD-10-CM | POA: Diagnosis not present

## 2023-06-19 DIAGNOSIS — J309 Allergic rhinitis, unspecified: Secondary | ICD-10-CM | POA: Diagnosis not present

## 2023-06-19 DIAGNOSIS — R7303 Prediabetes: Secondary | ICD-10-CM | POA: Diagnosis not present

## 2023-06-19 DIAGNOSIS — M80052D Age-related osteoporosis with current pathological fracture, left femur, subsequent encounter for fracture with routine healing: Secondary | ICD-10-CM | POA: Diagnosis not present

## 2023-06-19 DIAGNOSIS — G309 Alzheimer's disease, unspecified: Secondary | ICD-10-CM | POA: Diagnosis not present

## 2023-06-19 DIAGNOSIS — Z9181 History of falling: Secondary | ICD-10-CM | POA: Diagnosis not present

## 2023-06-19 DIAGNOSIS — N9989 Other postprocedural complications and disorders of genitourinary system: Secondary | ICD-10-CM | POA: Diagnosis not present

## 2023-06-19 DIAGNOSIS — F02A4 Dementia in other diseases classified elsewhere, mild, with anxiety: Secondary | ICD-10-CM | POA: Diagnosis not present

## 2023-06-19 DIAGNOSIS — E785 Hyperlipidemia, unspecified: Secondary | ICD-10-CM | POA: Diagnosis not present

## 2023-06-20 DIAGNOSIS — E039 Hypothyroidism, unspecified: Secondary | ICD-10-CM | POA: Diagnosis not present

## 2023-06-20 DIAGNOSIS — D6869 Other thrombophilia: Secondary | ICD-10-CM | POA: Diagnosis not present

## 2023-06-20 DIAGNOSIS — G309 Alzheimer's disease, unspecified: Secondary | ICD-10-CM | POA: Diagnosis not present

## 2023-06-21 DIAGNOSIS — G309 Alzheimer's disease, unspecified: Secondary | ICD-10-CM | POA: Diagnosis not present

## 2023-06-21 DIAGNOSIS — Z86718 Personal history of other venous thrombosis and embolism: Secondary | ICD-10-CM | POA: Diagnosis not present

## 2023-06-21 DIAGNOSIS — Z9181 History of falling: Secondary | ICD-10-CM | POA: Diagnosis not present

## 2023-06-21 DIAGNOSIS — M80052D Age-related osteoporosis with current pathological fracture, left femur, subsequent encounter for fracture with routine healing: Secondary | ICD-10-CM | POA: Diagnosis not present

## 2023-06-21 DIAGNOSIS — R7303 Prediabetes: Secondary | ICD-10-CM | POA: Diagnosis not present

## 2023-06-21 DIAGNOSIS — I10 Essential (primary) hypertension: Secondary | ICD-10-CM | POA: Diagnosis not present

## 2023-06-21 DIAGNOSIS — R338 Other retention of urine: Secondary | ICD-10-CM | POA: Diagnosis not present

## 2023-06-21 DIAGNOSIS — D62 Acute posthemorrhagic anemia: Secondary | ICD-10-CM | POA: Diagnosis not present

## 2023-06-21 DIAGNOSIS — E039 Hypothyroidism, unspecified: Secondary | ICD-10-CM | POA: Diagnosis not present

## 2023-06-21 DIAGNOSIS — Z79899 Other long term (current) drug therapy: Secondary | ICD-10-CM | POA: Diagnosis not present

## 2023-06-21 DIAGNOSIS — I739 Peripheral vascular disease, unspecified: Secondary | ICD-10-CM | POA: Diagnosis not present

## 2023-06-21 DIAGNOSIS — J309 Allergic rhinitis, unspecified: Secondary | ICD-10-CM | POA: Diagnosis not present

## 2023-06-21 DIAGNOSIS — Z7901 Long term (current) use of anticoagulants: Secondary | ICD-10-CM | POA: Diagnosis not present

## 2023-06-21 DIAGNOSIS — F02A3 Dementia in other diseases classified elsewhere, mild, with mood disturbance: Secondary | ICD-10-CM | POA: Diagnosis not present

## 2023-06-21 DIAGNOSIS — F02A4 Dementia in other diseases classified elsewhere, mild, with anxiety: Secondary | ICD-10-CM | POA: Diagnosis not present

## 2023-06-21 DIAGNOSIS — K219 Gastro-esophageal reflux disease without esophagitis: Secondary | ICD-10-CM | POA: Diagnosis not present

## 2023-06-21 DIAGNOSIS — J449 Chronic obstructive pulmonary disease, unspecified: Secondary | ICD-10-CM | POA: Diagnosis not present

## 2023-06-21 DIAGNOSIS — E785 Hyperlipidemia, unspecified: Secondary | ICD-10-CM | POA: Diagnosis not present

## 2023-06-21 DIAGNOSIS — M069 Rheumatoid arthritis, unspecified: Secondary | ICD-10-CM | POA: Diagnosis not present

## 2023-06-21 DIAGNOSIS — F339 Major depressive disorder, recurrent, unspecified: Secondary | ICD-10-CM | POA: Diagnosis not present

## 2023-06-21 DIAGNOSIS — D6869 Other thrombophilia: Secondary | ICD-10-CM | POA: Diagnosis not present

## 2023-06-21 DIAGNOSIS — N9989 Other postprocedural complications and disorders of genitourinary system: Secondary | ICD-10-CM | POA: Diagnosis not present

## 2023-06-21 DIAGNOSIS — E876 Hypokalemia: Secondary | ICD-10-CM | POA: Diagnosis not present

## 2023-06-21 DIAGNOSIS — Z87891 Personal history of nicotine dependence: Secondary | ICD-10-CM | POA: Diagnosis not present

## 2023-06-21 DIAGNOSIS — G8929 Other chronic pain: Secondary | ICD-10-CM | POA: Diagnosis not present

## 2023-06-22 DIAGNOSIS — G8929 Other chronic pain: Secondary | ICD-10-CM | POA: Diagnosis not present

## 2023-06-22 DIAGNOSIS — F339 Major depressive disorder, recurrent, unspecified: Secondary | ICD-10-CM | POA: Diagnosis not present

## 2023-06-22 DIAGNOSIS — R338 Other retention of urine: Secondary | ICD-10-CM | POA: Diagnosis not present

## 2023-06-22 DIAGNOSIS — D62 Acute posthemorrhagic anemia: Secondary | ICD-10-CM | POA: Diagnosis not present

## 2023-06-22 DIAGNOSIS — R7303 Prediabetes: Secondary | ICD-10-CM | POA: Diagnosis not present

## 2023-06-22 DIAGNOSIS — F02A4 Dementia in other diseases classified elsewhere, mild, with anxiety: Secondary | ICD-10-CM | POA: Diagnosis not present

## 2023-06-22 DIAGNOSIS — I739 Peripheral vascular disease, unspecified: Secondary | ICD-10-CM | POA: Diagnosis not present

## 2023-06-22 DIAGNOSIS — Z9181 History of falling: Secondary | ICD-10-CM | POA: Diagnosis not present

## 2023-06-22 DIAGNOSIS — Z79899 Other long term (current) drug therapy: Secondary | ICD-10-CM | POA: Diagnosis not present

## 2023-06-22 DIAGNOSIS — D6869 Other thrombophilia: Secondary | ICD-10-CM | POA: Diagnosis not present

## 2023-06-22 DIAGNOSIS — K219 Gastro-esophageal reflux disease without esophagitis: Secondary | ICD-10-CM | POA: Diagnosis not present

## 2023-06-22 DIAGNOSIS — M069 Rheumatoid arthritis, unspecified: Secondary | ICD-10-CM | POA: Diagnosis not present

## 2023-06-22 DIAGNOSIS — G309 Alzheimer's disease, unspecified: Secondary | ICD-10-CM | POA: Diagnosis not present

## 2023-06-22 DIAGNOSIS — Z86718 Personal history of other venous thrombosis and embolism: Secondary | ICD-10-CM | POA: Diagnosis not present

## 2023-06-22 DIAGNOSIS — F02A3 Dementia in other diseases classified elsewhere, mild, with mood disturbance: Secondary | ICD-10-CM | POA: Diagnosis not present

## 2023-06-22 DIAGNOSIS — J309 Allergic rhinitis, unspecified: Secondary | ICD-10-CM | POA: Diagnosis not present

## 2023-06-22 DIAGNOSIS — E876 Hypokalemia: Secondary | ICD-10-CM | POA: Diagnosis not present

## 2023-06-22 DIAGNOSIS — N9989 Other postprocedural complications and disorders of genitourinary system: Secondary | ICD-10-CM | POA: Diagnosis not present

## 2023-06-22 DIAGNOSIS — J449 Chronic obstructive pulmonary disease, unspecified: Secondary | ICD-10-CM | POA: Diagnosis not present

## 2023-06-22 DIAGNOSIS — Z7901 Long term (current) use of anticoagulants: Secondary | ICD-10-CM | POA: Diagnosis not present

## 2023-06-22 DIAGNOSIS — E039 Hypothyroidism, unspecified: Secondary | ICD-10-CM | POA: Diagnosis not present

## 2023-06-22 DIAGNOSIS — Z87891 Personal history of nicotine dependence: Secondary | ICD-10-CM | POA: Diagnosis not present

## 2023-06-22 DIAGNOSIS — E785 Hyperlipidemia, unspecified: Secondary | ICD-10-CM | POA: Diagnosis not present

## 2023-06-22 DIAGNOSIS — M80052D Age-related osteoporosis with current pathological fracture, left femur, subsequent encounter for fracture with routine healing: Secondary | ICD-10-CM | POA: Diagnosis not present

## 2023-06-22 DIAGNOSIS — I10 Essential (primary) hypertension: Secondary | ICD-10-CM | POA: Diagnosis not present

## 2023-06-23 DIAGNOSIS — Z9181 History of falling: Secondary | ICD-10-CM | POA: Diagnosis not present

## 2023-06-23 DIAGNOSIS — M069 Rheumatoid arthritis, unspecified: Secondary | ICD-10-CM | POA: Diagnosis not present

## 2023-06-23 DIAGNOSIS — E785 Hyperlipidemia, unspecified: Secondary | ICD-10-CM | POA: Diagnosis not present

## 2023-06-23 DIAGNOSIS — J309 Allergic rhinitis, unspecified: Secondary | ICD-10-CM | POA: Diagnosis not present

## 2023-06-23 DIAGNOSIS — R338 Other retention of urine: Secondary | ICD-10-CM | POA: Diagnosis not present

## 2023-06-23 DIAGNOSIS — R7303 Prediabetes: Secondary | ICD-10-CM | POA: Diagnosis not present

## 2023-06-23 DIAGNOSIS — Z7901 Long term (current) use of anticoagulants: Secondary | ICD-10-CM | POA: Diagnosis not present

## 2023-06-23 DIAGNOSIS — E039 Hypothyroidism, unspecified: Secondary | ICD-10-CM | POA: Diagnosis not present

## 2023-06-23 DIAGNOSIS — Z86718 Personal history of other venous thrombosis and embolism: Secondary | ICD-10-CM | POA: Diagnosis not present

## 2023-06-23 DIAGNOSIS — D62 Acute posthemorrhagic anemia: Secondary | ICD-10-CM | POA: Diagnosis not present

## 2023-06-23 DIAGNOSIS — N9989 Other postprocedural complications and disorders of genitourinary system: Secondary | ICD-10-CM | POA: Diagnosis not present

## 2023-06-23 DIAGNOSIS — J449 Chronic obstructive pulmonary disease, unspecified: Secondary | ICD-10-CM | POA: Diagnosis not present

## 2023-06-23 DIAGNOSIS — F02A3 Dementia in other diseases classified elsewhere, mild, with mood disturbance: Secondary | ICD-10-CM | POA: Diagnosis not present

## 2023-06-23 DIAGNOSIS — G309 Alzheimer's disease, unspecified: Secondary | ICD-10-CM | POA: Diagnosis not present

## 2023-06-23 DIAGNOSIS — G8929 Other chronic pain: Secondary | ICD-10-CM | POA: Diagnosis not present

## 2023-06-23 DIAGNOSIS — F339 Major depressive disorder, recurrent, unspecified: Secondary | ICD-10-CM | POA: Diagnosis not present

## 2023-06-23 DIAGNOSIS — I10 Essential (primary) hypertension: Secondary | ICD-10-CM | POA: Diagnosis not present

## 2023-06-23 DIAGNOSIS — F02A4 Dementia in other diseases classified elsewhere, mild, with anxiety: Secondary | ICD-10-CM | POA: Diagnosis not present

## 2023-06-23 DIAGNOSIS — K219 Gastro-esophageal reflux disease without esophagitis: Secondary | ICD-10-CM | POA: Diagnosis not present

## 2023-06-23 DIAGNOSIS — Z87891 Personal history of nicotine dependence: Secondary | ICD-10-CM | POA: Diagnosis not present

## 2023-06-23 DIAGNOSIS — E876 Hypokalemia: Secondary | ICD-10-CM | POA: Diagnosis not present

## 2023-06-23 DIAGNOSIS — D6869 Other thrombophilia: Secondary | ICD-10-CM | POA: Diagnosis not present

## 2023-06-23 DIAGNOSIS — M80052D Age-related osteoporosis with current pathological fracture, left femur, subsequent encounter for fracture with routine healing: Secondary | ICD-10-CM | POA: Diagnosis not present

## 2023-06-23 DIAGNOSIS — I739 Peripheral vascular disease, unspecified: Secondary | ICD-10-CM | POA: Diagnosis not present

## 2023-06-23 DIAGNOSIS — Z79899 Other long term (current) drug therapy: Secondary | ICD-10-CM | POA: Diagnosis not present

## 2023-06-27 DIAGNOSIS — G309 Alzheimer's disease, unspecified: Secondary | ICD-10-CM | POA: Diagnosis not present

## 2023-06-27 DIAGNOSIS — E785 Hyperlipidemia, unspecified: Secondary | ICD-10-CM | POA: Diagnosis not present

## 2023-06-27 DIAGNOSIS — N9989 Other postprocedural complications and disorders of genitourinary system: Secondary | ICD-10-CM | POA: Diagnosis not present

## 2023-06-27 DIAGNOSIS — Z87891 Personal history of nicotine dependence: Secondary | ICD-10-CM | POA: Diagnosis not present

## 2023-06-27 DIAGNOSIS — J309 Allergic rhinitis, unspecified: Secondary | ICD-10-CM | POA: Diagnosis not present

## 2023-06-27 DIAGNOSIS — Z9181 History of falling: Secondary | ICD-10-CM | POA: Diagnosis not present

## 2023-06-27 DIAGNOSIS — M80052D Age-related osteoporosis with current pathological fracture, left femur, subsequent encounter for fracture with routine healing: Secondary | ICD-10-CM | POA: Diagnosis not present

## 2023-06-27 DIAGNOSIS — F339 Major depressive disorder, recurrent, unspecified: Secondary | ICD-10-CM | POA: Diagnosis not present

## 2023-06-27 DIAGNOSIS — E039 Hypothyroidism, unspecified: Secondary | ICD-10-CM | POA: Diagnosis not present

## 2023-06-27 DIAGNOSIS — D62 Acute posthemorrhagic anemia: Secondary | ICD-10-CM | POA: Diagnosis not present

## 2023-06-27 DIAGNOSIS — Z79899 Other long term (current) drug therapy: Secondary | ICD-10-CM | POA: Diagnosis not present

## 2023-06-27 DIAGNOSIS — M069 Rheumatoid arthritis, unspecified: Secondary | ICD-10-CM | POA: Diagnosis not present

## 2023-06-27 DIAGNOSIS — E876 Hypokalemia: Secondary | ICD-10-CM | POA: Diagnosis not present

## 2023-06-27 DIAGNOSIS — D6869 Other thrombophilia: Secondary | ICD-10-CM | POA: Diagnosis not present

## 2023-06-27 DIAGNOSIS — I739 Peripheral vascular disease, unspecified: Secondary | ICD-10-CM | POA: Diagnosis not present

## 2023-06-27 DIAGNOSIS — Z7901 Long term (current) use of anticoagulants: Secondary | ICD-10-CM | POA: Diagnosis not present

## 2023-06-27 DIAGNOSIS — R338 Other retention of urine: Secondary | ICD-10-CM | POA: Diagnosis not present

## 2023-06-27 DIAGNOSIS — Z86718 Personal history of other venous thrombosis and embolism: Secondary | ICD-10-CM | POA: Diagnosis not present

## 2023-06-27 DIAGNOSIS — F02A3 Dementia in other diseases classified elsewhere, mild, with mood disturbance: Secondary | ICD-10-CM | POA: Diagnosis not present

## 2023-06-27 DIAGNOSIS — K219 Gastro-esophageal reflux disease without esophagitis: Secondary | ICD-10-CM | POA: Diagnosis not present

## 2023-06-27 DIAGNOSIS — G8929 Other chronic pain: Secondary | ICD-10-CM | POA: Diagnosis not present

## 2023-06-27 DIAGNOSIS — I10 Essential (primary) hypertension: Secondary | ICD-10-CM | POA: Diagnosis not present

## 2023-06-27 DIAGNOSIS — J449 Chronic obstructive pulmonary disease, unspecified: Secondary | ICD-10-CM | POA: Diagnosis not present

## 2023-06-27 DIAGNOSIS — F02A4 Dementia in other diseases classified elsewhere, mild, with anxiety: Secondary | ICD-10-CM | POA: Diagnosis not present

## 2023-06-27 DIAGNOSIS — R7303 Prediabetes: Secondary | ICD-10-CM | POA: Diagnosis not present

## 2023-06-28 DIAGNOSIS — Z87442 Personal history of urinary calculi: Secondary | ICD-10-CM | POA: Diagnosis not present

## 2023-06-28 DIAGNOSIS — N390 Urinary tract infection, site not specified: Secondary | ICD-10-CM | POA: Diagnosis not present

## 2023-06-28 DIAGNOSIS — R102 Pelvic and perineal pain: Secondary | ICD-10-CM | POA: Diagnosis not present

## 2023-06-29 DIAGNOSIS — D62 Acute posthemorrhagic anemia: Secondary | ICD-10-CM | POA: Diagnosis not present

## 2023-06-29 DIAGNOSIS — Z79899 Other long term (current) drug therapy: Secondary | ICD-10-CM | POA: Diagnosis not present

## 2023-06-29 DIAGNOSIS — N9989 Other postprocedural complications and disorders of genitourinary system: Secondary | ICD-10-CM | POA: Diagnosis not present

## 2023-06-29 DIAGNOSIS — F02A4 Dementia in other diseases classified elsewhere, mild, with anxiety: Secondary | ICD-10-CM | POA: Diagnosis not present

## 2023-06-29 DIAGNOSIS — R338 Other retention of urine: Secondary | ICD-10-CM | POA: Diagnosis not present

## 2023-06-29 DIAGNOSIS — E876 Hypokalemia: Secondary | ICD-10-CM | POA: Diagnosis not present

## 2023-06-29 DIAGNOSIS — Z7901 Long term (current) use of anticoagulants: Secondary | ICD-10-CM | POA: Diagnosis not present

## 2023-06-29 DIAGNOSIS — G8929 Other chronic pain: Secondary | ICD-10-CM | POA: Diagnosis not present

## 2023-06-29 DIAGNOSIS — Z87891 Personal history of nicotine dependence: Secondary | ICD-10-CM | POA: Diagnosis not present

## 2023-06-29 DIAGNOSIS — K219 Gastro-esophageal reflux disease without esophagitis: Secondary | ICD-10-CM | POA: Diagnosis not present

## 2023-06-29 DIAGNOSIS — J449 Chronic obstructive pulmonary disease, unspecified: Secondary | ICD-10-CM | POA: Diagnosis not present

## 2023-06-29 DIAGNOSIS — M80052D Age-related osteoporosis with current pathological fracture, left femur, subsequent encounter for fracture with routine healing: Secondary | ICD-10-CM | POA: Diagnosis not present

## 2023-06-29 DIAGNOSIS — D6869 Other thrombophilia: Secondary | ICD-10-CM | POA: Diagnosis not present

## 2023-06-29 DIAGNOSIS — I10 Essential (primary) hypertension: Secondary | ICD-10-CM | POA: Diagnosis not present

## 2023-06-29 DIAGNOSIS — E785 Hyperlipidemia, unspecified: Secondary | ICD-10-CM | POA: Diagnosis not present

## 2023-06-29 DIAGNOSIS — R7303 Prediabetes: Secondary | ICD-10-CM | POA: Diagnosis not present

## 2023-06-29 DIAGNOSIS — Z86718 Personal history of other venous thrombosis and embolism: Secondary | ICD-10-CM | POA: Diagnosis not present

## 2023-06-29 DIAGNOSIS — I739 Peripheral vascular disease, unspecified: Secondary | ICD-10-CM | POA: Diagnosis not present

## 2023-06-29 DIAGNOSIS — G309 Alzheimer's disease, unspecified: Secondary | ICD-10-CM | POA: Diagnosis not present

## 2023-06-29 DIAGNOSIS — F339 Major depressive disorder, recurrent, unspecified: Secondary | ICD-10-CM | POA: Diagnosis not present

## 2023-06-29 DIAGNOSIS — E039 Hypothyroidism, unspecified: Secondary | ICD-10-CM | POA: Diagnosis not present

## 2023-06-29 DIAGNOSIS — Z9181 History of falling: Secondary | ICD-10-CM | POA: Diagnosis not present

## 2023-06-29 DIAGNOSIS — J309 Allergic rhinitis, unspecified: Secondary | ICD-10-CM | POA: Diagnosis not present

## 2023-06-29 DIAGNOSIS — F02A3 Dementia in other diseases classified elsewhere, mild, with mood disturbance: Secondary | ICD-10-CM | POA: Diagnosis not present

## 2023-06-29 DIAGNOSIS — M069 Rheumatoid arthritis, unspecified: Secondary | ICD-10-CM | POA: Diagnosis not present

## 2023-06-30 DIAGNOSIS — I739 Peripheral vascular disease, unspecified: Secondary | ICD-10-CM | POA: Diagnosis not present

## 2023-06-30 DIAGNOSIS — R7303 Prediabetes: Secondary | ICD-10-CM | POA: Diagnosis not present

## 2023-06-30 DIAGNOSIS — M80052D Age-related osteoporosis with current pathological fracture, left femur, subsequent encounter for fracture with routine healing: Secondary | ICD-10-CM | POA: Diagnosis not present

## 2023-06-30 DIAGNOSIS — F02A4 Dementia in other diseases classified elsewhere, mild, with anxiety: Secondary | ICD-10-CM | POA: Diagnosis not present

## 2023-06-30 DIAGNOSIS — G309 Alzheimer's disease, unspecified: Secondary | ICD-10-CM | POA: Diagnosis not present

## 2023-06-30 DIAGNOSIS — E876 Hypokalemia: Secondary | ICD-10-CM | POA: Diagnosis not present

## 2023-06-30 DIAGNOSIS — E785 Hyperlipidemia, unspecified: Secondary | ICD-10-CM | POA: Diagnosis not present

## 2023-06-30 DIAGNOSIS — K219 Gastro-esophageal reflux disease without esophagitis: Secondary | ICD-10-CM | POA: Diagnosis not present

## 2023-06-30 DIAGNOSIS — D62 Acute posthemorrhagic anemia: Secondary | ICD-10-CM | POA: Diagnosis not present

## 2023-06-30 DIAGNOSIS — Z7901 Long term (current) use of anticoagulants: Secondary | ICD-10-CM | POA: Diagnosis not present

## 2023-06-30 DIAGNOSIS — J309 Allergic rhinitis, unspecified: Secondary | ICD-10-CM | POA: Diagnosis not present

## 2023-06-30 DIAGNOSIS — Z87891 Personal history of nicotine dependence: Secondary | ICD-10-CM | POA: Diagnosis not present

## 2023-06-30 DIAGNOSIS — Z9181 History of falling: Secondary | ICD-10-CM | POA: Diagnosis not present

## 2023-06-30 DIAGNOSIS — G8929 Other chronic pain: Secondary | ICD-10-CM | POA: Diagnosis not present

## 2023-06-30 DIAGNOSIS — Z79899 Other long term (current) drug therapy: Secondary | ICD-10-CM | POA: Diagnosis not present

## 2023-06-30 DIAGNOSIS — Z86718 Personal history of other venous thrombosis and embolism: Secondary | ICD-10-CM | POA: Diagnosis not present

## 2023-06-30 DIAGNOSIS — D6869 Other thrombophilia: Secondary | ICD-10-CM | POA: Diagnosis not present

## 2023-06-30 DIAGNOSIS — M069 Rheumatoid arthritis, unspecified: Secondary | ICD-10-CM | POA: Diagnosis not present

## 2023-06-30 DIAGNOSIS — N9989 Other postprocedural complications and disorders of genitourinary system: Secondary | ICD-10-CM | POA: Diagnosis not present

## 2023-06-30 DIAGNOSIS — I10 Essential (primary) hypertension: Secondary | ICD-10-CM | POA: Diagnosis not present

## 2023-06-30 DIAGNOSIS — E039 Hypothyroidism, unspecified: Secondary | ICD-10-CM | POA: Diagnosis not present

## 2023-06-30 DIAGNOSIS — J449 Chronic obstructive pulmonary disease, unspecified: Secondary | ICD-10-CM | POA: Diagnosis not present

## 2023-06-30 DIAGNOSIS — F02A3 Dementia in other diseases classified elsewhere, mild, with mood disturbance: Secondary | ICD-10-CM | POA: Diagnosis not present

## 2023-06-30 DIAGNOSIS — R338 Other retention of urine: Secondary | ICD-10-CM | POA: Diagnosis not present

## 2023-06-30 DIAGNOSIS — F339 Major depressive disorder, recurrent, unspecified: Secondary | ICD-10-CM | POA: Diagnosis not present

## 2023-07-04 DIAGNOSIS — R300 Dysuria: Secondary | ICD-10-CM | POA: Diagnosis not present

## 2023-07-05 DIAGNOSIS — F339 Major depressive disorder, recurrent, unspecified: Secondary | ICD-10-CM | POA: Diagnosis not present

## 2023-07-05 DIAGNOSIS — I10 Essential (primary) hypertension: Secondary | ICD-10-CM | POA: Diagnosis not present

## 2023-07-05 DIAGNOSIS — E785 Hyperlipidemia, unspecified: Secondary | ICD-10-CM | POA: Diagnosis not present

## 2023-07-05 DIAGNOSIS — Z9181 History of falling: Secondary | ICD-10-CM | POA: Diagnosis not present

## 2023-07-05 DIAGNOSIS — G8929 Other chronic pain: Secondary | ICD-10-CM | POA: Diagnosis not present

## 2023-07-05 DIAGNOSIS — M80052D Age-related osteoporosis with current pathological fracture, left femur, subsequent encounter for fracture with routine healing: Secondary | ICD-10-CM | POA: Diagnosis not present

## 2023-07-05 DIAGNOSIS — E876 Hypokalemia: Secondary | ICD-10-CM | POA: Diagnosis not present

## 2023-07-05 DIAGNOSIS — J449 Chronic obstructive pulmonary disease, unspecified: Secondary | ICD-10-CM | POA: Diagnosis not present

## 2023-07-05 DIAGNOSIS — D62 Acute posthemorrhagic anemia: Secondary | ICD-10-CM | POA: Diagnosis not present

## 2023-07-05 DIAGNOSIS — M069 Rheumatoid arthritis, unspecified: Secondary | ICD-10-CM | POA: Diagnosis not present

## 2023-07-05 DIAGNOSIS — K219 Gastro-esophageal reflux disease without esophagitis: Secondary | ICD-10-CM | POA: Diagnosis not present

## 2023-07-05 DIAGNOSIS — F02A4 Dementia in other diseases classified elsewhere, mild, with anxiety: Secondary | ICD-10-CM | POA: Diagnosis not present

## 2023-07-05 DIAGNOSIS — Z87891 Personal history of nicotine dependence: Secondary | ICD-10-CM | POA: Diagnosis not present

## 2023-07-05 DIAGNOSIS — Z86718 Personal history of other venous thrombosis and embolism: Secondary | ICD-10-CM | POA: Diagnosis not present

## 2023-07-05 DIAGNOSIS — D6869 Other thrombophilia: Secondary | ICD-10-CM | POA: Diagnosis not present

## 2023-07-05 DIAGNOSIS — Z7901 Long term (current) use of anticoagulants: Secondary | ICD-10-CM | POA: Diagnosis not present

## 2023-07-05 DIAGNOSIS — F02A3 Dementia in other diseases classified elsewhere, mild, with mood disturbance: Secondary | ICD-10-CM | POA: Diagnosis not present

## 2023-07-05 DIAGNOSIS — I739 Peripheral vascular disease, unspecified: Secondary | ICD-10-CM | POA: Diagnosis not present

## 2023-07-05 DIAGNOSIS — G309 Alzheimer's disease, unspecified: Secondary | ICD-10-CM | POA: Diagnosis not present

## 2023-07-05 DIAGNOSIS — N9989 Other postprocedural complications and disorders of genitourinary system: Secondary | ICD-10-CM | POA: Diagnosis not present

## 2023-07-05 DIAGNOSIS — R7303 Prediabetes: Secondary | ICD-10-CM | POA: Diagnosis not present

## 2023-07-05 DIAGNOSIS — R338 Other retention of urine: Secondary | ICD-10-CM | POA: Diagnosis not present

## 2023-07-05 DIAGNOSIS — J309 Allergic rhinitis, unspecified: Secondary | ICD-10-CM | POA: Diagnosis not present

## 2023-07-05 DIAGNOSIS — Z79899 Other long term (current) drug therapy: Secondary | ICD-10-CM | POA: Diagnosis not present

## 2023-07-05 DIAGNOSIS — E039 Hypothyroidism, unspecified: Secondary | ICD-10-CM | POA: Diagnosis not present

## 2023-07-07 DIAGNOSIS — E876 Hypokalemia: Secondary | ICD-10-CM | POA: Diagnosis not present

## 2023-07-07 DIAGNOSIS — F339 Major depressive disorder, recurrent, unspecified: Secondary | ICD-10-CM | POA: Diagnosis not present

## 2023-07-07 DIAGNOSIS — Z79899 Other long term (current) drug therapy: Secondary | ICD-10-CM | POA: Diagnosis not present

## 2023-07-07 DIAGNOSIS — D6869 Other thrombophilia: Secondary | ICD-10-CM | POA: Diagnosis not present

## 2023-07-07 DIAGNOSIS — R338 Other retention of urine: Secondary | ICD-10-CM | POA: Diagnosis not present

## 2023-07-07 DIAGNOSIS — Z87891 Personal history of nicotine dependence: Secondary | ICD-10-CM | POA: Diagnosis not present

## 2023-07-07 DIAGNOSIS — Z7901 Long term (current) use of anticoagulants: Secondary | ICD-10-CM | POA: Diagnosis not present

## 2023-07-07 DIAGNOSIS — I10 Essential (primary) hypertension: Secondary | ICD-10-CM | POA: Diagnosis not present

## 2023-07-07 DIAGNOSIS — G309 Alzheimer's disease, unspecified: Secondary | ICD-10-CM | POA: Diagnosis not present

## 2023-07-07 DIAGNOSIS — F02A3 Dementia in other diseases classified elsewhere, mild, with mood disturbance: Secondary | ICD-10-CM | POA: Diagnosis not present

## 2023-07-07 DIAGNOSIS — E785 Hyperlipidemia, unspecified: Secondary | ICD-10-CM | POA: Diagnosis not present

## 2023-07-07 DIAGNOSIS — M069 Rheumatoid arthritis, unspecified: Secondary | ICD-10-CM | POA: Diagnosis not present

## 2023-07-07 DIAGNOSIS — D62 Acute posthemorrhagic anemia: Secondary | ICD-10-CM | POA: Diagnosis not present

## 2023-07-07 DIAGNOSIS — R7303 Prediabetes: Secondary | ICD-10-CM | POA: Diagnosis not present

## 2023-07-07 DIAGNOSIS — K219 Gastro-esophageal reflux disease without esophagitis: Secondary | ICD-10-CM | POA: Diagnosis not present

## 2023-07-07 DIAGNOSIS — M80052D Age-related osteoporosis with current pathological fracture, left femur, subsequent encounter for fracture with routine healing: Secondary | ICD-10-CM | POA: Diagnosis not present

## 2023-07-07 DIAGNOSIS — N9989 Other postprocedural complications and disorders of genitourinary system: Secondary | ICD-10-CM | POA: Diagnosis not present

## 2023-07-07 DIAGNOSIS — Z9181 History of falling: Secondary | ICD-10-CM | POA: Diagnosis not present

## 2023-07-07 DIAGNOSIS — E039 Hypothyroidism, unspecified: Secondary | ICD-10-CM | POA: Diagnosis not present

## 2023-07-07 DIAGNOSIS — Z86718 Personal history of other venous thrombosis and embolism: Secondary | ICD-10-CM | POA: Diagnosis not present

## 2023-07-07 DIAGNOSIS — F02A4 Dementia in other diseases classified elsewhere, mild, with anxiety: Secondary | ICD-10-CM | POA: Diagnosis not present

## 2023-07-07 DIAGNOSIS — I739 Peripheral vascular disease, unspecified: Secondary | ICD-10-CM | POA: Diagnosis not present

## 2023-07-07 DIAGNOSIS — G8929 Other chronic pain: Secondary | ICD-10-CM | POA: Diagnosis not present

## 2023-07-07 DIAGNOSIS — J309 Allergic rhinitis, unspecified: Secondary | ICD-10-CM | POA: Diagnosis not present

## 2023-07-07 DIAGNOSIS — J449 Chronic obstructive pulmonary disease, unspecified: Secondary | ICD-10-CM | POA: Diagnosis not present

## 2023-07-12 DIAGNOSIS — L89312 Pressure ulcer of right buttock, stage 2: Secondary | ICD-10-CM | POA: Diagnosis not present

## 2023-07-12 DIAGNOSIS — J019 Acute sinusitis, unspecified: Secondary | ICD-10-CM | POA: Diagnosis not present

## 2023-07-12 DIAGNOSIS — L89322 Pressure ulcer of left buttock, stage 2: Secondary | ICD-10-CM | POA: Diagnosis not present

## 2023-07-12 DIAGNOSIS — Z6822 Body mass index (BMI) 22.0-22.9, adult: Secondary | ICD-10-CM | POA: Diagnosis not present

## 2023-07-14 DIAGNOSIS — E876 Hypokalemia: Secondary | ICD-10-CM | POA: Diagnosis not present

## 2023-07-14 DIAGNOSIS — F02A3 Dementia in other diseases classified elsewhere, mild, with mood disturbance: Secondary | ICD-10-CM | POA: Diagnosis not present

## 2023-07-14 DIAGNOSIS — F02A4 Dementia in other diseases classified elsewhere, mild, with anxiety: Secondary | ICD-10-CM | POA: Diagnosis not present

## 2023-07-14 DIAGNOSIS — R7303 Prediabetes: Secondary | ICD-10-CM | POA: Diagnosis not present

## 2023-07-14 DIAGNOSIS — J449 Chronic obstructive pulmonary disease, unspecified: Secondary | ICD-10-CM | POA: Diagnosis not present

## 2023-07-14 DIAGNOSIS — Z7901 Long term (current) use of anticoagulants: Secondary | ICD-10-CM | POA: Diagnosis not present

## 2023-07-14 DIAGNOSIS — I10 Essential (primary) hypertension: Secondary | ICD-10-CM | POA: Diagnosis not present

## 2023-07-14 DIAGNOSIS — Z87891 Personal history of nicotine dependence: Secondary | ICD-10-CM | POA: Diagnosis not present

## 2023-07-14 DIAGNOSIS — F339 Major depressive disorder, recurrent, unspecified: Secondary | ICD-10-CM | POA: Diagnosis not present

## 2023-07-14 DIAGNOSIS — Z79899 Other long term (current) drug therapy: Secondary | ICD-10-CM | POA: Diagnosis not present

## 2023-07-14 DIAGNOSIS — M80052D Age-related osteoporosis with current pathological fracture, left femur, subsequent encounter for fracture with routine healing: Secondary | ICD-10-CM | POA: Diagnosis not present

## 2023-07-14 DIAGNOSIS — E785 Hyperlipidemia, unspecified: Secondary | ICD-10-CM | POA: Diagnosis not present

## 2023-07-14 DIAGNOSIS — G309 Alzheimer's disease, unspecified: Secondary | ICD-10-CM | POA: Diagnosis not present

## 2023-07-14 DIAGNOSIS — M069 Rheumatoid arthritis, unspecified: Secondary | ICD-10-CM | POA: Diagnosis not present

## 2023-07-14 DIAGNOSIS — Z86718 Personal history of other venous thrombosis and embolism: Secondary | ICD-10-CM | POA: Diagnosis not present

## 2023-07-14 DIAGNOSIS — E039 Hypothyroidism, unspecified: Secondary | ICD-10-CM | POA: Diagnosis not present

## 2023-07-14 DIAGNOSIS — K219 Gastro-esophageal reflux disease without esophagitis: Secondary | ICD-10-CM | POA: Diagnosis not present

## 2023-07-14 DIAGNOSIS — D62 Acute posthemorrhagic anemia: Secondary | ICD-10-CM | POA: Diagnosis not present

## 2023-07-14 DIAGNOSIS — J309 Allergic rhinitis, unspecified: Secondary | ICD-10-CM | POA: Diagnosis not present

## 2023-07-14 DIAGNOSIS — Z9181 History of falling: Secondary | ICD-10-CM | POA: Diagnosis not present

## 2023-07-14 DIAGNOSIS — N9989 Other postprocedural complications and disorders of genitourinary system: Secondary | ICD-10-CM | POA: Diagnosis not present

## 2023-07-14 DIAGNOSIS — G8929 Other chronic pain: Secondary | ICD-10-CM | POA: Diagnosis not present

## 2023-07-14 DIAGNOSIS — R338 Other retention of urine: Secondary | ICD-10-CM | POA: Diagnosis not present

## 2023-07-14 DIAGNOSIS — D6869 Other thrombophilia: Secondary | ICD-10-CM | POA: Diagnosis not present

## 2023-07-14 DIAGNOSIS — I739 Peripheral vascular disease, unspecified: Secondary | ICD-10-CM | POA: Diagnosis not present

## 2023-07-19 DIAGNOSIS — K219 Gastro-esophageal reflux disease without esophagitis: Secondary | ICD-10-CM | POA: Diagnosis not present

## 2023-07-19 DIAGNOSIS — Z7901 Long term (current) use of anticoagulants: Secondary | ICD-10-CM | POA: Diagnosis not present

## 2023-07-19 DIAGNOSIS — Z9181 History of falling: Secondary | ICD-10-CM | POA: Diagnosis not present

## 2023-07-19 DIAGNOSIS — I739 Peripheral vascular disease, unspecified: Secondary | ICD-10-CM | POA: Diagnosis not present

## 2023-07-19 DIAGNOSIS — Z86718 Personal history of other venous thrombosis and embolism: Secondary | ICD-10-CM | POA: Diagnosis not present

## 2023-07-19 DIAGNOSIS — N9989 Other postprocedural complications and disorders of genitourinary system: Secondary | ICD-10-CM | POA: Diagnosis not present

## 2023-07-19 DIAGNOSIS — G8929 Other chronic pain: Secondary | ICD-10-CM | POA: Diagnosis not present

## 2023-07-19 DIAGNOSIS — D6869 Other thrombophilia: Secondary | ICD-10-CM | POA: Diagnosis not present

## 2023-07-19 DIAGNOSIS — J449 Chronic obstructive pulmonary disease, unspecified: Secondary | ICD-10-CM | POA: Diagnosis not present

## 2023-07-19 DIAGNOSIS — E785 Hyperlipidemia, unspecified: Secondary | ICD-10-CM | POA: Diagnosis not present

## 2023-07-19 DIAGNOSIS — E039 Hypothyroidism, unspecified: Secondary | ICD-10-CM | POA: Diagnosis not present

## 2023-07-19 DIAGNOSIS — I10 Essential (primary) hypertension: Secondary | ICD-10-CM | POA: Diagnosis not present

## 2023-07-19 DIAGNOSIS — E876 Hypokalemia: Secondary | ICD-10-CM | POA: Diagnosis not present

## 2023-07-19 DIAGNOSIS — F02A3 Dementia in other diseases classified elsewhere, mild, with mood disturbance: Secondary | ICD-10-CM | POA: Diagnosis not present

## 2023-07-19 DIAGNOSIS — Z87891 Personal history of nicotine dependence: Secondary | ICD-10-CM | POA: Diagnosis not present

## 2023-07-19 DIAGNOSIS — F02A4 Dementia in other diseases classified elsewhere, mild, with anxiety: Secondary | ICD-10-CM | POA: Diagnosis not present

## 2023-07-19 DIAGNOSIS — M069 Rheumatoid arthritis, unspecified: Secondary | ICD-10-CM | POA: Diagnosis not present

## 2023-07-19 DIAGNOSIS — F339 Major depressive disorder, recurrent, unspecified: Secondary | ICD-10-CM | POA: Diagnosis not present

## 2023-07-19 DIAGNOSIS — D62 Acute posthemorrhagic anemia: Secondary | ICD-10-CM | POA: Diagnosis not present

## 2023-07-19 DIAGNOSIS — R7303 Prediabetes: Secondary | ICD-10-CM | POA: Diagnosis not present

## 2023-07-19 DIAGNOSIS — G309 Alzheimer's disease, unspecified: Secondary | ICD-10-CM | POA: Diagnosis not present

## 2023-07-19 DIAGNOSIS — R338 Other retention of urine: Secondary | ICD-10-CM | POA: Diagnosis not present

## 2023-07-19 DIAGNOSIS — J309 Allergic rhinitis, unspecified: Secondary | ICD-10-CM | POA: Diagnosis not present

## 2023-07-19 DIAGNOSIS — Z79899 Other long term (current) drug therapy: Secondary | ICD-10-CM | POA: Diagnosis not present

## 2023-07-19 DIAGNOSIS — M80052D Age-related osteoporosis with current pathological fracture, left femur, subsequent encounter for fracture with routine healing: Secondary | ICD-10-CM | POA: Diagnosis not present

## 2023-07-21 DIAGNOSIS — M810 Age-related osteoporosis without current pathological fracture: Secondary | ICD-10-CM | POA: Diagnosis not present

## 2023-07-21 DIAGNOSIS — G309 Alzheimer's disease, unspecified: Secondary | ICD-10-CM | POA: Diagnosis not present

## 2023-07-21 DIAGNOSIS — E039 Hypothyroidism, unspecified: Secondary | ICD-10-CM | POA: Diagnosis not present

## 2023-07-24 DIAGNOSIS — Z9181 History of falling: Secondary | ICD-10-CM | POA: Diagnosis not present

## 2023-07-24 DIAGNOSIS — M80052D Age-related osteoporosis with current pathological fracture, left femur, subsequent encounter for fracture with routine healing: Secondary | ICD-10-CM | POA: Diagnosis not present

## 2023-07-24 DIAGNOSIS — R7303 Prediabetes: Secondary | ICD-10-CM | POA: Diagnosis not present

## 2023-07-24 DIAGNOSIS — I10 Essential (primary) hypertension: Secondary | ICD-10-CM | POA: Diagnosis not present

## 2023-07-24 DIAGNOSIS — F02A4 Dementia in other diseases classified elsewhere, mild, with anxiety: Secondary | ICD-10-CM | POA: Diagnosis not present

## 2023-07-24 DIAGNOSIS — R338 Other retention of urine: Secondary | ICD-10-CM | POA: Diagnosis not present

## 2023-07-24 DIAGNOSIS — K219 Gastro-esophageal reflux disease without esophagitis: Secondary | ICD-10-CM | POA: Diagnosis not present

## 2023-07-24 DIAGNOSIS — N9989 Other postprocedural complications and disorders of genitourinary system: Secondary | ICD-10-CM | POA: Diagnosis not present

## 2023-07-24 DIAGNOSIS — F02A3 Dementia in other diseases classified elsewhere, mild, with mood disturbance: Secondary | ICD-10-CM | POA: Diagnosis not present

## 2023-07-24 DIAGNOSIS — G8929 Other chronic pain: Secondary | ICD-10-CM | POA: Diagnosis not present

## 2023-07-24 DIAGNOSIS — E785 Hyperlipidemia, unspecified: Secondary | ICD-10-CM | POA: Diagnosis not present

## 2023-07-24 DIAGNOSIS — Z87891 Personal history of nicotine dependence: Secondary | ICD-10-CM | POA: Diagnosis not present

## 2023-07-24 DIAGNOSIS — J449 Chronic obstructive pulmonary disease, unspecified: Secondary | ICD-10-CM | POA: Diagnosis not present

## 2023-07-24 DIAGNOSIS — Z79899 Other long term (current) drug therapy: Secondary | ICD-10-CM | POA: Diagnosis not present

## 2023-07-24 DIAGNOSIS — J309 Allergic rhinitis, unspecified: Secondary | ICD-10-CM | POA: Diagnosis not present

## 2023-07-24 DIAGNOSIS — G309 Alzheimer's disease, unspecified: Secondary | ICD-10-CM | POA: Diagnosis not present

## 2023-07-24 DIAGNOSIS — M069 Rheumatoid arthritis, unspecified: Secondary | ICD-10-CM | POA: Diagnosis not present

## 2023-07-24 DIAGNOSIS — Z86718 Personal history of other venous thrombosis and embolism: Secondary | ICD-10-CM | POA: Diagnosis not present

## 2023-07-24 DIAGNOSIS — I739 Peripheral vascular disease, unspecified: Secondary | ICD-10-CM | POA: Diagnosis not present

## 2023-07-24 DIAGNOSIS — E876 Hypokalemia: Secondary | ICD-10-CM | POA: Diagnosis not present

## 2023-07-24 DIAGNOSIS — Z7901 Long term (current) use of anticoagulants: Secondary | ICD-10-CM | POA: Diagnosis not present

## 2023-07-24 DIAGNOSIS — E039 Hypothyroidism, unspecified: Secondary | ICD-10-CM | POA: Diagnosis not present

## 2023-07-24 DIAGNOSIS — D62 Acute posthemorrhagic anemia: Secondary | ICD-10-CM | POA: Diagnosis not present

## 2023-07-24 DIAGNOSIS — D6869 Other thrombophilia: Secondary | ICD-10-CM | POA: Diagnosis not present

## 2023-07-24 DIAGNOSIS — F339 Major depressive disorder, recurrent, unspecified: Secondary | ICD-10-CM | POA: Diagnosis not present

## 2023-07-31 DIAGNOSIS — Z86718 Personal history of other venous thrombosis and embolism: Secondary | ICD-10-CM | POA: Diagnosis not present

## 2023-07-31 DIAGNOSIS — M069 Rheumatoid arthritis, unspecified: Secondary | ICD-10-CM | POA: Diagnosis not present

## 2023-07-31 DIAGNOSIS — F02A3 Dementia in other diseases classified elsewhere, mild, with mood disturbance: Secondary | ICD-10-CM | POA: Diagnosis not present

## 2023-07-31 DIAGNOSIS — I739 Peripheral vascular disease, unspecified: Secondary | ICD-10-CM | POA: Diagnosis not present

## 2023-07-31 DIAGNOSIS — D62 Acute posthemorrhagic anemia: Secondary | ICD-10-CM | POA: Diagnosis not present

## 2023-07-31 DIAGNOSIS — R338 Other retention of urine: Secondary | ICD-10-CM | POA: Diagnosis not present

## 2023-07-31 DIAGNOSIS — Z79899 Other long term (current) drug therapy: Secondary | ICD-10-CM | POA: Diagnosis not present

## 2023-07-31 DIAGNOSIS — D6869 Other thrombophilia: Secondary | ICD-10-CM | POA: Diagnosis not present

## 2023-07-31 DIAGNOSIS — Z9181 History of falling: Secondary | ICD-10-CM | POA: Diagnosis not present

## 2023-07-31 DIAGNOSIS — J309 Allergic rhinitis, unspecified: Secondary | ICD-10-CM | POA: Diagnosis not present

## 2023-07-31 DIAGNOSIS — F02A4 Dementia in other diseases classified elsewhere, mild, with anxiety: Secondary | ICD-10-CM | POA: Diagnosis not present

## 2023-07-31 DIAGNOSIS — F339 Major depressive disorder, recurrent, unspecified: Secondary | ICD-10-CM | POA: Diagnosis not present

## 2023-07-31 DIAGNOSIS — E785 Hyperlipidemia, unspecified: Secondary | ICD-10-CM | POA: Diagnosis not present

## 2023-07-31 DIAGNOSIS — E039 Hypothyroidism, unspecified: Secondary | ICD-10-CM | POA: Diagnosis not present

## 2023-07-31 DIAGNOSIS — N9989 Other postprocedural complications and disorders of genitourinary system: Secondary | ICD-10-CM | POA: Diagnosis not present

## 2023-07-31 DIAGNOSIS — Z87891 Personal history of nicotine dependence: Secondary | ICD-10-CM | POA: Diagnosis not present

## 2023-07-31 DIAGNOSIS — E876 Hypokalemia: Secondary | ICD-10-CM | POA: Diagnosis not present

## 2023-07-31 DIAGNOSIS — Z7901 Long term (current) use of anticoagulants: Secondary | ICD-10-CM | POA: Diagnosis not present

## 2023-07-31 DIAGNOSIS — G309 Alzheimer's disease, unspecified: Secondary | ICD-10-CM | POA: Diagnosis not present

## 2023-07-31 DIAGNOSIS — M80052D Age-related osteoporosis with current pathological fracture, left femur, subsequent encounter for fracture with routine healing: Secondary | ICD-10-CM | POA: Diagnosis not present

## 2023-07-31 DIAGNOSIS — K219 Gastro-esophageal reflux disease without esophagitis: Secondary | ICD-10-CM | POA: Diagnosis not present

## 2023-07-31 DIAGNOSIS — R7303 Prediabetes: Secondary | ICD-10-CM | POA: Diagnosis not present

## 2023-07-31 DIAGNOSIS — I10 Essential (primary) hypertension: Secondary | ICD-10-CM | POA: Diagnosis not present

## 2023-07-31 DIAGNOSIS — G8929 Other chronic pain: Secondary | ICD-10-CM | POA: Diagnosis not present

## 2023-07-31 DIAGNOSIS — J449 Chronic obstructive pulmonary disease, unspecified: Secondary | ICD-10-CM | POA: Diagnosis not present

## 2023-08-02 DIAGNOSIS — M810 Age-related osteoporosis without current pathological fracture: Secondary | ICD-10-CM | POA: Diagnosis not present

## 2023-08-20 DIAGNOSIS — E039 Hypothyroidism, unspecified: Secondary | ICD-10-CM | POA: Diagnosis not present

## 2023-08-20 DIAGNOSIS — D6869 Other thrombophilia: Secondary | ICD-10-CM | POA: Diagnosis not present

## 2023-08-20 DIAGNOSIS — G309 Alzheimer's disease, unspecified: Secondary | ICD-10-CM | POA: Diagnosis not present

## 2023-09-20 DIAGNOSIS — J449 Chronic obstructive pulmonary disease, unspecified: Secondary | ICD-10-CM | POA: Diagnosis not present

## 2023-09-20 DIAGNOSIS — G309 Alzheimer's disease, unspecified: Secondary | ICD-10-CM | POA: Diagnosis not present

## 2023-09-20 DIAGNOSIS — D6869 Other thrombophilia: Secondary | ICD-10-CM | POA: Diagnosis not present

## 2023-09-26 DIAGNOSIS — Z9181 History of falling: Secondary | ICD-10-CM | POA: Diagnosis not present

## 2023-09-26 DIAGNOSIS — D6869 Other thrombophilia: Secondary | ICD-10-CM | POA: Diagnosis not present

## 2023-09-26 DIAGNOSIS — Z7901 Long term (current) use of anticoagulants: Secondary | ICD-10-CM | POA: Diagnosis not present

## 2023-09-26 DIAGNOSIS — Z5181 Encounter for therapeutic drug level monitoring: Secondary | ICD-10-CM | POA: Diagnosis not present

## 2023-09-26 DIAGNOSIS — Z6822 Body mass index (BMI) 22.0-22.9, adult: Secondary | ICD-10-CM | POA: Diagnosis not present

## 2023-09-26 DIAGNOSIS — Z86718 Personal history of other venous thrombosis and embolism: Secondary | ICD-10-CM | POA: Diagnosis not present

## 2023-10-10 DIAGNOSIS — D6869 Other thrombophilia: Secondary | ICD-10-CM | POA: Diagnosis not present

## 2023-10-10 DIAGNOSIS — Z6822 Body mass index (BMI) 22.0-22.9, adult: Secondary | ICD-10-CM | POA: Diagnosis not present

## 2023-10-10 DIAGNOSIS — Z7901 Long term (current) use of anticoagulants: Secondary | ICD-10-CM | POA: Diagnosis not present

## 2023-10-10 DIAGNOSIS — Z86718 Personal history of other venous thrombosis and embolism: Secondary | ICD-10-CM | POA: Diagnosis not present

## 2023-10-21 DIAGNOSIS — D6869 Other thrombophilia: Secondary | ICD-10-CM | POA: Diagnosis not present

## 2023-10-21 DIAGNOSIS — J449 Chronic obstructive pulmonary disease, unspecified: Secondary | ICD-10-CM | POA: Diagnosis not present

## 2023-10-21 DIAGNOSIS — G309 Alzheimer's disease, unspecified: Secondary | ICD-10-CM | POA: Diagnosis not present

## 2023-10-24 DIAGNOSIS — R7303 Prediabetes: Secondary | ICD-10-CM | POA: Diagnosis not present

## 2023-10-24 DIAGNOSIS — E785 Hyperlipidemia, unspecified: Secondary | ICD-10-CM | POA: Diagnosis not present

## 2023-10-24 DIAGNOSIS — E876 Hypokalemia: Secondary | ICD-10-CM | POA: Diagnosis not present

## 2023-10-24 DIAGNOSIS — E039 Hypothyroidism, unspecified: Secondary | ICD-10-CM | POA: Diagnosis not present

## 2023-11-01 DIAGNOSIS — Z86718 Personal history of other venous thrombosis and embolism: Secondary | ICD-10-CM | POA: Diagnosis not present

## 2023-11-01 DIAGNOSIS — E785 Hyperlipidemia, unspecified: Secondary | ICD-10-CM | POA: Diagnosis not present

## 2023-11-01 DIAGNOSIS — D6869 Other thrombophilia: Secondary | ICD-10-CM | POA: Diagnosis not present

## 2023-11-01 DIAGNOSIS — Z139 Encounter for screening, unspecified: Secondary | ICD-10-CM | POA: Diagnosis not present

## 2023-11-01 DIAGNOSIS — Z7901 Long term (current) use of anticoagulants: Secondary | ICD-10-CM | POA: Diagnosis not present

## 2023-11-01 DIAGNOSIS — Z6822 Body mass index (BMI) 22.0-22.9, adult: Secondary | ICD-10-CM | POA: Diagnosis not present

## 2023-11-01 DIAGNOSIS — R7303 Prediabetes: Secondary | ICD-10-CM | POA: Diagnosis not present

## 2023-11-06 DIAGNOSIS — Z6822 Body mass index (BMI) 22.0-22.9, adult: Secondary | ICD-10-CM | POA: Diagnosis not present

## 2023-11-06 DIAGNOSIS — R059 Cough, unspecified: Secondary | ICD-10-CM | POA: Diagnosis not present

## 2023-11-06 DIAGNOSIS — Z20822 Contact with and (suspected) exposure to covid-19: Secondary | ICD-10-CM | POA: Diagnosis not present

## 2023-11-06 DIAGNOSIS — J069 Acute upper respiratory infection, unspecified: Secondary | ICD-10-CM | POA: Diagnosis not present

## 2023-11-18 DIAGNOSIS — G309 Alzheimer's disease, unspecified: Secondary | ICD-10-CM | POA: Diagnosis not present

## 2023-11-18 DIAGNOSIS — E785 Hyperlipidemia, unspecified: Secondary | ICD-10-CM | POA: Diagnosis not present

## 2023-11-18 DIAGNOSIS — D6869 Other thrombophilia: Secondary | ICD-10-CM | POA: Diagnosis not present

## 2023-11-29 DIAGNOSIS — Z139 Encounter for screening, unspecified: Secondary | ICD-10-CM | POA: Diagnosis not present

## 2023-11-29 DIAGNOSIS — Z5181 Encounter for therapeutic drug level monitoring: Secondary | ICD-10-CM | POA: Diagnosis not present

## 2023-11-29 DIAGNOSIS — Z7189 Other specified counseling: Secondary | ICD-10-CM | POA: Diagnosis not present

## 2023-11-29 DIAGNOSIS — Z136 Encounter for screening for cardiovascular disorders: Secondary | ICD-10-CM | POA: Diagnosis not present

## 2023-11-29 DIAGNOSIS — Z86718 Personal history of other venous thrombosis and embolism: Secondary | ICD-10-CM | POA: Diagnosis not present

## 2023-11-29 DIAGNOSIS — D6869 Other thrombophilia: Secondary | ICD-10-CM | POA: Diagnosis not present

## 2023-11-29 DIAGNOSIS — R000 Tachycardia, unspecified: Secondary | ICD-10-CM | POA: Diagnosis not present

## 2023-11-29 DIAGNOSIS — Z7901 Long term (current) use of anticoagulants: Secondary | ICD-10-CM | POA: Diagnosis not present

## 2023-11-29 DIAGNOSIS — Z0000 Encounter for general adult medical examination without abnormal findings: Secondary | ICD-10-CM | POA: Diagnosis not present

## 2023-11-29 DIAGNOSIS — Z1389 Encounter for screening for other disorder: Secondary | ICD-10-CM | POA: Diagnosis not present

## 2023-12-13 DIAGNOSIS — Z5181 Encounter for therapeutic drug level monitoring: Secondary | ICD-10-CM | POA: Diagnosis not present

## 2023-12-13 DIAGNOSIS — Z6822 Body mass index (BMI) 22.0-22.9, adult: Secondary | ICD-10-CM | POA: Diagnosis not present

## 2023-12-13 DIAGNOSIS — Z86718 Personal history of other venous thrombosis and embolism: Secondary | ICD-10-CM | POA: Diagnosis not present

## 2023-12-13 DIAGNOSIS — D6869 Other thrombophilia: Secondary | ICD-10-CM | POA: Diagnosis not present

## 2023-12-13 DIAGNOSIS — G8928 Other chronic postprocedural pain: Secondary | ICD-10-CM | POA: Diagnosis not present

## 2023-12-13 DIAGNOSIS — Z7901 Long term (current) use of anticoagulants: Secondary | ICD-10-CM | POA: Diagnosis not present

## 2023-12-18 DIAGNOSIS — I10 Essential (primary) hypertension: Secondary | ICD-10-CM | POA: Diagnosis not present

## 2023-12-19 DIAGNOSIS — Z6822 Body mass index (BMI) 22.0-22.9, adult: Secondary | ICD-10-CM | POA: Diagnosis not present

## 2023-12-19 DIAGNOSIS — K921 Melena: Secondary | ICD-10-CM | POA: Diagnosis not present

## 2023-12-19 DIAGNOSIS — A084 Viral intestinal infection, unspecified: Secondary | ICD-10-CM | POA: Diagnosis not present

## 2023-12-19 DIAGNOSIS — Z86718 Personal history of other venous thrombosis and embolism: Secondary | ICD-10-CM | POA: Diagnosis not present

## 2023-12-19 DIAGNOSIS — I10 Essential (primary) hypertension: Secondary | ICD-10-CM | POA: Diagnosis not present

## 2023-12-21 DIAGNOSIS — R000 Tachycardia, unspecified: Secondary | ICD-10-CM | POA: Diagnosis not present

## 2023-12-27 DIAGNOSIS — I10 Essential (primary) hypertension: Secondary | ICD-10-CM | POA: Diagnosis not present

## 2024-01-02 ENCOUNTER — Other Ambulatory Visit: Payer: Self-pay | Admitting: *Deleted

## 2024-01-03 DIAGNOSIS — D6869 Other thrombophilia: Secondary | ICD-10-CM | POA: Diagnosis not present

## 2024-01-03 DIAGNOSIS — Z5181 Encounter for therapeutic drug level monitoring: Secondary | ICD-10-CM | POA: Diagnosis not present

## 2024-01-03 DIAGNOSIS — Z6822 Body mass index (BMI) 22.0-22.9, adult: Secondary | ICD-10-CM | POA: Diagnosis not present

## 2024-01-03 DIAGNOSIS — Z7901 Long term (current) use of anticoagulants: Secondary | ICD-10-CM | POA: Diagnosis not present

## 2024-01-03 DIAGNOSIS — Z86718 Personal history of other venous thrombosis and embolism: Secondary | ICD-10-CM | POA: Diagnosis not present

## 2024-01-04 ENCOUNTER — Encounter: Payer: Self-pay | Admitting: *Deleted

## 2024-01-04 ENCOUNTER — Encounter: Payer: Self-pay | Admitting: Cardiology

## 2024-01-04 DIAGNOSIS — F339 Major depressive disorder, recurrent, unspecified: Secondary | ICD-10-CM | POA: Insufficient documentation

## 2024-01-04 DIAGNOSIS — I739 Peripheral vascular disease, unspecified: Secondary | ICD-10-CM | POA: Insufficient documentation

## 2024-01-04 DIAGNOSIS — I10 Essential (primary) hypertension: Secondary | ICD-10-CM | POA: Insufficient documentation

## 2024-01-04 DIAGNOSIS — J439 Emphysema, unspecified: Secondary | ICD-10-CM

## 2024-01-04 DIAGNOSIS — J449 Chronic obstructive pulmonary disease, unspecified: Secondary | ICD-10-CM | POA: Insufficient documentation

## 2024-01-04 DIAGNOSIS — G309 Alzheimer's disease, unspecified: Secondary | ICD-10-CM | POA: Insufficient documentation

## 2024-01-04 DIAGNOSIS — R7303 Prediabetes: Secondary | ICD-10-CM | POA: Insufficient documentation

## 2024-01-04 DIAGNOSIS — M810 Age-related osteoporosis without current pathological fracture: Secondary | ICD-10-CM | POA: Insufficient documentation

## 2024-01-10 DIAGNOSIS — E785 Hyperlipidemia, unspecified: Secondary | ICD-10-CM | POA: Diagnosis not present

## 2024-01-16 ENCOUNTER — Ambulatory Visit: Attending: Cardiology | Admitting: Cardiology

## 2024-01-16 ENCOUNTER — Encounter: Payer: Self-pay | Admitting: Cardiology

## 2024-01-16 VITALS — BP 138/76 | HR 74 | Ht 65.0 in | Wt 131.2 lb

## 2024-01-16 DIAGNOSIS — K219 Gastro-esophageal reflux disease without esophagitis: Secondary | ICD-10-CM

## 2024-01-16 DIAGNOSIS — I10 Essential (primary) hypertension: Secondary | ICD-10-CM | POA: Diagnosis not present

## 2024-01-16 DIAGNOSIS — I472 Ventricular tachycardia, unspecified: Secondary | ICD-10-CM

## 2024-01-16 DIAGNOSIS — I471 Supraventricular tachycardia, unspecified: Secondary | ICD-10-CM | POA: Diagnosis not present

## 2024-01-16 DIAGNOSIS — E782 Mixed hyperlipidemia: Secondary | ICD-10-CM

## 2024-01-16 DIAGNOSIS — R0609 Other forms of dyspnea: Secondary | ICD-10-CM

## 2024-01-16 NOTE — Patient Instructions (Addendum)
 Medication Instructions:  Your physician recommends that you continue on your current medications as directed. Please refer to the Current Medication list given to you today.  *If you need a refill on your cardiac medications before your next appointment, please call your pharmacy*   Lab Work: None Ordered If you have labs (blood work) drawn today and your tests are completely normal, you will receive your results only by: MyChart Message (if you have MyChart) OR A paper copy in the mail If you have any lab test that is abnormal or we need to change your treatment, we will call you to review the results.   Testing/Procedures: Your physician has requested that you have an echocardiogram. Echocardiography is a painless test that uses sound waves to create images of your heart. It provides your doctor with information about the size and shape of your heart and how well your heart's chambers and valves are working. This procedure takes approximately one hour. There are no restrictions for this procedure. Please do NOT wear cologne, perfume, aftershave, or lotions (deodorant is allowed). Please arrive 15 minutes prior to your appointment time.  Please note: We ask at that you not bring children with you during ultrasound (echo/ vascular) testing. Due to room size and safety concerns, children are not allowed in the ultrasound rooms during exams. Our front office staff cannot provide observation of children in our lobby area while testing is being conducted. An adult accompanying a patient to their appointment will only be allowed in the ultrasound room at the discretion of the ultrasound technician under special circumstances. We apologize for any inconvenience.   Your physician has requested that you have a lexiscan myoview. For further information please visit [email or web address] Please follow instruction sheet, as given.  The test will take approximately 3 to 4 hours to complete; you may bring  reading material.  If someone comes with you to your appointment, they will need to remain in the main lobby due to limited space in the testing area.  appointment**  How to prepare for your Myocardial Perfusion Test: Do not eat or drink 3 hours prior to your test, except you may have water. Do not consume products containing caffeine (regular or decaffeinated) 12 hours prior to your test. (ex: coffee, chocolate, sodas, tea). Do bring a list of your current medications with you.  If not listed below, you may take your medications as normal. Do wear comfortable clothes (no dresses or overalls) and walking shoes, tennis shoes preferred (No heels or open toe shoes are allowed). Do NOT wear cologne, perfume, aftershave, or lotions (deodorant is allowed). If these instructions are not followed, your test will have to be rescheduled.     Follow-Up: At [facility], you and your health needs are our priority.  As part of our continuing mission to provide you with exceptional heart care, we have created designated Provider Care Teams.  These Care Teams include your primary Cardiologist (physician) and Advanced Practice Providers (APPs -  Physician Assistants and Nurse Practitioners) who all work together to provide you with the care you need, when you need it.  We recommend signing up for the patient portal called "MyChart".  Sign up information is provided on this After Visit Summary.  MyChart is used to connect with patients for Virtual Visits (Telemedicine).  Patients are able to view lab/test results, encounter notes, upcoming appointments, etc.  Non-urgent messages can be sent to your provider as well.   To learn more about what you  can do with MyChart, go to [email or web address].    Your next appointment:   2 month(s)  The format for your next appointment:   In Person  Provider:   Ralene Burger, MD    Other Instructions NA

## 2024-01-16 NOTE — Progress Notes (Signed)
 Cardiology Consultation:    Date:  01/16/2024   ID:  Brittany Aguilar, DOB 09-Jan-1941, MRN [id number]  PCP:  Elyce Hams, Marguerita Shih, MD  Cardiologist:  Ralene Burger, MD   Referring MD: Elyce Hams, Marguerita Shih, *   Chief Complaint  Patient presents with   Palpitations   Dizziness   Abnormal Zio    History of Present Illness:    Brittany Aguilar is a 83 y.o. female who is being seen today for the evaluation of abnormal Zio patch at the request of Elyce Hams, Irma M, *.  Past medical history significant for DVT, gastroesophageal reflux disease, COPD, she complained of having vertigo.  As a part of evaluation she had monitor placed she was found to have short episode of nonsustained ventricular tachycardia as well as supraventricular tachycardia.  2 episode of ventricular tachycardia first episode 4 beats at rate of 102-second episode 6 beats at rate of 165.  Also 228 episode of supraventricular tachycardia.  She was referred to us  for evaluation she described to have some dizziness but it really looks like orthostasis with vertigo.  She said when she turned her head very quickly room starts spinning around she feels dizzy she feels like she is going to pass.  Also she said sometimes when she gets up very quickly she gets very similar sensation she never passed out but she fell down many times that she got does have broken bones because of this.  She never had any heart trouble.  Does have family history of coronary disease but not premature, quit smoking more than 50 years ago.  No alcohol  abuse.  Recent admission to the hospital was because of left hip fracture required surgical intervention no complications.  No chest pain tightness squeezing pressure burning chest  Past Medical History:  Diagnosis Date   Anticoagulant long-term use    Anxiety 01/20/2020   Cardiac arrhythmia    Chronic recurrent major depressive disorder (HCC)    COPD (chronic obstructive pulmonary disease) (HCC)    Essential  hypertension    Gastroesophageal reflux disease without esophagitis 01/20/2020   History of DVT (deep vein thrombosis)    Hyperlipidemia 01/20/2020   Hypokalemia    Hypothyroidism 01/20/2020   Interstitial cystitis    Mild Alzheimer dementia (HCC)    Osteoporosis    PAD (peripheral artery disease) (HCC)    Prediabetes    Rheumatoid arthritis (HCC) 01/20/2020    Past Surgical History:  Procedure Laterality Date   BREAST SURGERY Left 1960   CHOLECYSTECTOMY  2012   HIP FRACTURE SURGERY Left 05/21/2023   Closed reduction internal fixation w/Gamma Nail   TUBAL LIGATION  1965   WRIST SURGERY Right 2020    Current Medications: Current Meds  Medication Sig   atorvastatin (LIPITOR) 80 MG tablet Take 80 mg by mouth every evening.   busPIRone (BUSPAR) 5 MG tablet Take 5 mg by mouth 2 (two) times daily.   hydrOXYzine (ATARAX) 10 MG tablet Take 10-20 mg by mouth every 6 (six) hours as needed for itching or anxiety.   levothyroxine (SYNTHROID) 75 MCG tablet Take 75 mcg by mouth daily before breakfast.   loratadine (CLARITIN) 10 MG tablet Take 10 mg by mouth daily.   meclizine (ANTIVERT) 25 MG tablet Take 25 mg by mouth every 8 (eight) hours as needed for dizziness.   metoprolol succinate (TOPROL-XL) 25 MG 24 hr tablet Take 25 mg by mouth daily.   potassium chloride (KLOR-CON) 10 MEQ tablet Take  10 mEq by mouth daily.   traMADol (ULTRAM) 50 MG tablet Take 50 mg by mouth at bedtime as needed for moderate pain (pain score 4-6).   warfarin (COUMADIN) 1 MG tablet Take 1 mg by mouth 2 (two) times a week. Wednesday and Saturday   warfarin (COUMADIN) 2 MG tablet Take 2 mg by mouth as directed. Every day except Wednesday and Saturday   [DISCONTINUED] albuterol  (VENTOLIN  HFA) 108 (90 Base) MCG/ACT inhaler Inhale 1-2 puffs into the lungs every 6 (six) hours as needed for wheezing or shortness of breath.   [DISCONTINUED] diclofenac Sodium (VOLTAREN) 1 % GEL Apply 4 g topically daily as needed (pain).    [DISCONTINUED] fluticasone (FLONASE) 50 MCG/ACT nasal spray Place 1 spray into both nostrils daily.   [DISCONTINUED] lisinopril (ZESTRIL) 10 MG tablet Take 10 mg by mouth daily.   [DISCONTINUED] nystatin cream (MYCOSTATIN) Apply 1 Application topically in the morning, at noon, in the evening, and at bedtime.   [DISCONTINUED] ondansetron  (ZOFRAN -ODT) 4 MG disintegrating tablet Take 4 mg by mouth every 8 (eight) hours as needed for nausea or vomiting.     Allergies:   Melatonin and Oxycodone    Social History   Socioeconomic History   Marital status: Widowed    Spouse name: Not on file   Number of children: Not on file   Years of education: Not on file   Highest education level: Not on file  Occupational History   Not on file  Tobacco Use   Smoking status: Former    Current packs/day: 0.00    Types: Cigarettes    Quit date: 09/2007    Years since quitting: 16.3   Smokeless tobacco: Never  Substance and Sexual Activity   Alcohol  use: Never   Drug use: Never   Sexual activity: Not on file  Other Topics Concern   Not on file  Social History Narrative   Not on file   Social Drivers of Health   Financial Resource Strain: Not on file  Food Insecurity: Not on file  Transportation Needs: Not on file  Physical Activity: Not on file  Stress: Not on file  Social Connections: Unknown (12/14/2022)   [facility] from [facility]   Social Network    Social Network: Not on file     Family History: The patient's family history includes Breast cancer in her sister; Heart disease in her brother and father. ROS:   Please see the history of present illness.    All 14 point review of systems negative except as described per history of present illness.  EKGs/Labs/Other Studies Reviewed:    The following studies were reviewed today:   EKG:  EKG Interpretation Date/Time:  Tuesday January 16 2024 10:45:36 EDT Ventricular Rate:  74 PR Interval:  124 QRS Duration:  64 QT  Interval:  364 QTC Calculation: 404 R Axis:   77  Text Interpretation: Sinus rhythm with Premature supraventricular complexes Otherwise normal ECG When compared with ECG of 26-Nov-2020 17:25, Premature supraventricular complexes are now Present Confirmed by Ralene Burger [phone]) on 01/16/2024 11:02:55 AM    Recent Labs: No results found for requested labs within last 365 days.  Recent Lipid Panel No results found for: "CHOL", "TRIG", "HDL", "CHOLHDL", "VLDL", "LDLCALC", "LDLDIRECT"  Physical Exam:    VS:  BP 138/76 (BP Location: Right Arm, Patient Position: Sitting)   Pulse 74   Ht 5\' 5"  (1.651 m)   Wt 131 lb 3.2 oz (59.5 kg)   SpO2 97%   BMI  21.83 kg/m     Wt Readings from Last 3 Encounters:  01/16/24 131 lb 3.2 oz (59.5 kg)  01/03/24 134 lb 6.1 oz (61 kg)  09/16/19 162 lb (73.5 kg)     GEN:  Well nourished, well developed in no acute distress HEENT: Normal NECK: No JVD; No carotid bruits LYMPHATICS: No lymphadenopathy CARDIAC: RRR, no murmurs, no rubs, no gallops RESPIRATORY:  Clear to auscultation without rales, wheezing or rhonchi  ABDOMEN: Soft, non-tender, non-distended MUSCULOSKELETAL:  No edema; No deformity  SKIN: Warm and dry NEUROLOGIC:  Alert and oriented x 3 PSYCHIATRIC:  Normal affect   ASSESSMENT:    1. Essential hypertension   2. Dyspnea on exertion   3. Supraventricular tachycardia (HCC)   4. Ventricular tachycardia (HCC)   5. Gastroesophageal reflux disease without esophagitis   6. Mixed hyperlipidemia    PLAN:    In order of problems listed above:  Supraventricular tachycardia: She was given prescription for metoprolol succinate 25 mg daily however she have not taken this medication yet.  She always read all side effect of medication and majority of medications are reluctant to take.  I encouraged her to start taking this medication. Ventricular tachycardia a little bit more serious issue required stratification that stratification show to  include evaluation of her left ventricle ejection fraction we will do it by doing echocardiogram, as well as evaluation for coronary artery disease.  Will schedule her to have a stress test. Gastroesophageal reflux disease stable. Dyslipidemia I did review K PN which show me her HDL of 46 and LDL 87 acceptable for this clinical scenario will get results over test will make adjustment if needed. Essential hypertension blood pressure seems to be controlled   Medication Adjustments/Labs and Tests Ordered: Current medicines are reviewed at length with the patient today.  Concerns regarding medicines are outlined above.  Orders Placed This Encounter  Procedures   MYOCARDIAL PERFUSION IMAGING   EKG 12-Lead   ECHOCARDIOGRAM COMPLETE   No orders of the defined types were placed in this encounter.   Signed, Manfred Seed, MD, [facility]. 01/16/2024 11:52 AM    [facility]

## 2024-01-17 DIAGNOSIS — Z6821 Body mass index (BMI) 21.0-21.9, adult: Secondary | ICD-10-CM | POA: Diagnosis not present

## 2024-01-17 DIAGNOSIS — E876 Hypokalemia: Secondary | ICD-10-CM | POA: Diagnosis not present

## 2024-01-17 DIAGNOSIS — I10 Essential (primary) hypertension: Secondary | ICD-10-CM | POA: Diagnosis not present

## 2024-01-17 DIAGNOSIS — D6869 Other thrombophilia: Secondary | ICD-10-CM | POA: Diagnosis not present

## 2024-01-17 DIAGNOSIS — Z86718 Personal history of other venous thrombosis and embolism: Secondary | ICD-10-CM | POA: Diagnosis not present

## 2024-01-17 DIAGNOSIS — Z7901 Long term (current) use of anticoagulants: Secondary | ICD-10-CM | POA: Diagnosis not present

## 2024-01-18 DIAGNOSIS — I10 Essential (primary) hypertension: Secondary | ICD-10-CM | POA: Diagnosis not present

## 2024-01-30 DIAGNOSIS — D6869 Other thrombophilia: Secondary | ICD-10-CM | POA: Diagnosis not present

## 2024-01-30 DIAGNOSIS — Z5181 Encounter for therapeutic drug level monitoring: Secondary | ICD-10-CM | POA: Diagnosis not present

## 2024-01-30 DIAGNOSIS — Z86718 Personal history of other venous thrombosis and embolism: Secondary | ICD-10-CM | POA: Diagnosis not present

## 2024-01-30 DIAGNOSIS — Z7901 Long term (current) use of anticoagulants: Secondary | ICD-10-CM | POA: Diagnosis not present

## 2024-01-30 DIAGNOSIS — Z6822 Body mass index (BMI) 22.0-22.9, adult: Secondary | ICD-10-CM | POA: Diagnosis not present

## 2024-01-31 ENCOUNTER — Telehealth: Payer: Self-pay | Admitting: *Deleted

## 2024-01-31 NOTE — Telephone Encounter (Signed)
 Spoke to patient and gave her detailed instructions about her STRESS TEST on 02/06/24 at 11:00.

## 2024-02-06 ENCOUNTER — Ambulatory Visit: Attending: Cardiology

## 2024-02-06 DIAGNOSIS — R0609 Other forms of dyspnea: Secondary | ICD-10-CM | POA: Diagnosis not present

## 2024-02-06 MED ORDER — TECHNETIUM TC 99M TETROFOSMIN IV KIT
29.7000 | PACK | Freq: Once | INTRAVENOUS | Status: AC | PRN
  Administered 2024-02-06: 29.7 via INTRAVENOUS

## 2024-02-06 MED ORDER — TECHNETIUM TC 99M TETROFOSMIN IV KIT
10.3000 | PACK | Freq: Once | INTRAVENOUS | Status: AC | PRN
  Administered 2024-02-06: 10.3 via INTRAVENOUS

## 2024-02-06 MED ORDER — REGADENOSON 0.4 MG/5ML IV SOLN
0.4000 mg | Freq: Once | INTRAVENOUS | Status: AC
  Administered 2024-02-06: 0.4 mg via INTRAVENOUS

## 2024-02-08 ENCOUNTER — Ambulatory Visit (HOSPITAL_BASED_OUTPATIENT_CLINIC_OR_DEPARTMENT_OTHER)
Admission: RE | Admit: 2024-02-08 | Discharge: 2024-02-08 | Disposition: A | Discharge status: Home / Self Care | Source: Ambulatory Visit | Attending: Cardiology | Admitting: Cardiology

## 2024-02-08 ENCOUNTER — Ambulatory Visit: Payer: Self-pay | Admitting: Cardiology

## 2024-02-08 DIAGNOSIS — R0609 Other forms of dyspnea: Secondary | ICD-10-CM | POA: Diagnosis not present

## 2024-02-08 LAB — MYOCARDIAL PERFUSION IMAGING
LV dias vol: 65 mL (ref 46–106)
LV sys vol: 28 mL
Nuc Stress EF: 57 %
Peak HR: 75 {beats}/min
Rest HR: 53 {beats}/min
Rest Nuclear Isotope Dose: 10.1 mCi
SRS: 0
SSS: 2
ST Depression (mm): 0 mm
Stress Nuclear Isotope Dose: 29.7 mCi
TID: 1.03

## 2024-02-08 LAB — ECHOCARDIOGRAM COMPLETE
AR max vel: 1.52 cm2
AV Area VTI: 1.5 cm2
AV Area mean vel: 1.51 cm2
AV Mean grad: 6 mmHg
AV Peak grad: 11.7 mmHg
Ao pk vel: 1.71 m/s
Area-P 1/2: 3.27 cm2
Calc EF: 73.3 %
MV M vel: 4.5 m/s
MV Peak grad: 81 mmHg
S' Lateral: 2.1 cm
Single Plane A2C EF: 75 %
Single Plane A4C EF: 72.9 %

## 2024-02-13 DIAGNOSIS — Z86718 Personal history of other venous thrombosis and embolism: Secondary | ICD-10-CM | POA: Diagnosis not present

## 2024-02-13 DIAGNOSIS — Z9181 History of falling: Secondary | ICD-10-CM | POA: Diagnosis not present

## 2024-02-13 DIAGNOSIS — Z7901 Long term (current) use of anticoagulants: Secondary | ICD-10-CM | POA: Diagnosis not present

## 2024-02-13 DIAGNOSIS — R42 Dizziness and giddiness: Secondary | ICD-10-CM | POA: Diagnosis not present

## 2024-02-13 DIAGNOSIS — D6869 Other thrombophilia: Secondary | ICD-10-CM | POA: Diagnosis not present

## 2024-02-13 DIAGNOSIS — Z5181 Encounter for therapeutic drug level monitoring: Secondary | ICD-10-CM | POA: Diagnosis not present

## 2024-02-13 DIAGNOSIS — Z6823 Body mass index (BMI) 23.0-23.9, adult: Secondary | ICD-10-CM | POA: Diagnosis not present

## 2024-02-14 DIAGNOSIS — H524 Presbyopia: Secondary | ICD-10-CM | POA: Diagnosis not present

## 2024-02-14 DIAGNOSIS — H2513 Age-related nuclear cataract, bilateral: Secondary | ICD-10-CM | POA: Diagnosis not present

## 2024-02-17 DIAGNOSIS — I10 Essential (primary) hypertension: Secondary | ICD-10-CM | POA: Diagnosis not present

## 2024-02-18 DIAGNOSIS — I10 Essential (primary) hypertension: Secondary | ICD-10-CM | POA: Diagnosis not present

## 2024-02-27 DIAGNOSIS — Z86718 Personal history of other venous thrombosis and embolism: Secondary | ICD-10-CM | POA: Diagnosis not present

## 2024-02-27 DIAGNOSIS — Z6822 Body mass index (BMI) 22.0-22.9, adult: Secondary | ICD-10-CM | POA: Diagnosis not present

## 2024-02-27 DIAGNOSIS — Z7901 Long term (current) use of anticoagulants: Secondary | ICD-10-CM | POA: Diagnosis not present

## 2024-02-27 DIAGNOSIS — D6869 Other thrombophilia: Secondary | ICD-10-CM | POA: Diagnosis not present

## 2024-02-27 DIAGNOSIS — Z5181 Encounter for therapeutic drug level monitoring: Secondary | ICD-10-CM | POA: Diagnosis not present

## 2024-03-06 DIAGNOSIS — A6920 Lyme disease, unspecified: Secondary | ICD-10-CM | POA: Diagnosis not present

## 2024-03-06 DIAGNOSIS — S70362A Insect bite (nonvenomous), left thigh, initial encounter: Secondary | ICD-10-CM | POA: Diagnosis not present

## 2024-03-06 DIAGNOSIS — Z7901 Long term (current) use of anticoagulants: Secondary | ICD-10-CM | POA: Diagnosis not present

## 2024-03-06 DIAGNOSIS — Z6822 Body mass index (BMI) 22.0-22.9, adult: Secondary | ICD-10-CM | POA: Diagnosis not present

## 2024-03-06 DIAGNOSIS — W57XXXA Bitten or stung by nonvenomous insect and other nonvenomous arthropods, initial encounter: Secondary | ICD-10-CM | POA: Diagnosis not present

## 2024-03-11 DIAGNOSIS — Z6822 Body mass index (BMI) 22.0-22.9, adult: Secondary | ICD-10-CM | POA: Diagnosis not present

## 2024-03-11 DIAGNOSIS — Z5181 Encounter for therapeutic drug level monitoring: Secondary | ICD-10-CM | POA: Diagnosis not present

## 2024-03-11 DIAGNOSIS — Z7901 Long term (current) use of anticoagulants: Secondary | ICD-10-CM | POA: Diagnosis not present

## 2024-03-11 DIAGNOSIS — D6869 Other thrombophilia: Secondary | ICD-10-CM | POA: Diagnosis not present

## 2024-03-11 DIAGNOSIS — Z86718 Personal history of other venous thrombosis and embolism: Secondary | ICD-10-CM | POA: Diagnosis not present

## 2024-03-18 DIAGNOSIS — Z6822 Body mass index (BMI) 22.0-22.9, adult: Secondary | ICD-10-CM | POA: Diagnosis not present

## 2024-03-18 DIAGNOSIS — Z5181 Encounter for therapeutic drug level monitoring: Secondary | ICD-10-CM | POA: Diagnosis not present

## 2024-03-18 DIAGNOSIS — Z86718 Personal history of other venous thrombosis and embolism: Secondary | ICD-10-CM | POA: Diagnosis not present

## 2024-03-18 DIAGNOSIS — I10 Essential (primary) hypertension: Secondary | ICD-10-CM | POA: Diagnosis not present

## 2024-03-18 DIAGNOSIS — Z7901 Long term (current) use of anticoagulants: Secondary | ICD-10-CM | POA: Diagnosis not present

## 2024-03-18 DIAGNOSIS — D6869 Other thrombophilia: Secondary | ICD-10-CM | POA: Diagnosis not present

## 2024-03-18 DIAGNOSIS — Z139 Encounter for screening, unspecified: Secondary | ICD-10-CM | POA: Diagnosis not present

## 2024-03-19 ENCOUNTER — Ambulatory Visit: Admitting: Cardiology

## 2024-03-19 DIAGNOSIS — I10 Essential (primary) hypertension: Secondary | ICD-10-CM | POA: Diagnosis not present

## 2024-04-18 DIAGNOSIS — I10 Essential (primary) hypertension: Secondary | ICD-10-CM | POA: Diagnosis not present

## 2024-04-19 DIAGNOSIS — I10 Essential (primary) hypertension: Secondary | ICD-10-CM | POA: Diagnosis not present

## 2024-04-30 DIAGNOSIS — E039 Hypothyroidism, unspecified: Secondary | ICD-10-CM | POA: Diagnosis not present

## 2024-04-30 DIAGNOSIS — R7303 Prediabetes: Secondary | ICD-10-CM | POA: Diagnosis not present

## 2024-04-30 DIAGNOSIS — E785 Hyperlipidemia, unspecified: Secondary | ICD-10-CM | POA: Diagnosis not present

## 2024-05-19 DIAGNOSIS — I10 Essential (primary) hypertension: Secondary | ICD-10-CM | POA: Diagnosis not present

## 2024-05-20 DIAGNOSIS — I10 Essential (primary) hypertension: Secondary | ICD-10-CM | POA: Diagnosis not present

## 2024-05-29 DIAGNOSIS — R7303 Prediabetes: Secondary | ICD-10-CM | POA: Diagnosis not present

## 2024-05-29 DIAGNOSIS — I10 Essential (primary) hypertension: Secondary | ICD-10-CM | POA: Diagnosis not present

## 2024-05-29 DIAGNOSIS — Z5181 Encounter for therapeutic drug level monitoring: Secondary | ICD-10-CM | POA: Diagnosis not present

## 2024-05-29 DIAGNOSIS — E785 Hyperlipidemia, unspecified: Secondary | ICD-10-CM | POA: Diagnosis not present

## 2024-05-29 DIAGNOSIS — E039 Hypothyroidism, unspecified: Secondary | ICD-10-CM | POA: Diagnosis not present

## 2024-05-29 DIAGNOSIS — Z6824 Body mass index (BMI) 24.0-24.9, adult: Secondary | ICD-10-CM | POA: Diagnosis not present

## 2024-05-29 DIAGNOSIS — Z23 Encounter for immunization: Secondary | ICD-10-CM | POA: Diagnosis not present

## 2024-06-18 DIAGNOSIS — I10 Essential (primary) hypertension: Secondary | ICD-10-CM | POA: Diagnosis not present

## 2024-06-19 DIAGNOSIS — I10 Essential (primary) hypertension: Secondary | ICD-10-CM | POA: Diagnosis not present

## 2024-06-20 DIAGNOSIS — E876 Hypokalemia: Secondary | ICD-10-CM | POA: Diagnosis not present

## 2024-06-25 DIAGNOSIS — D6869 Other thrombophilia: Secondary | ICD-10-CM | POA: Diagnosis not present

## 2024-06-25 DIAGNOSIS — Z86718 Personal history of other venous thrombosis and embolism: Secondary | ICD-10-CM | POA: Diagnosis not present

## 2024-06-25 DIAGNOSIS — Z7901 Long term (current) use of anticoagulants: Secondary | ICD-10-CM | POA: Diagnosis not present

## 2024-06-25 DIAGNOSIS — Z5181 Encounter for therapeutic drug level monitoring: Secondary | ICD-10-CM | POA: Diagnosis not present

## 2024-06-25 DIAGNOSIS — Z6823 Body mass index (BMI) 23.0-23.9, adult: Secondary | ICD-10-CM | POA: Diagnosis not present

## 2024-09-17 ENCOUNTER — Encounter: Payer: Self-pay | Admitting: Physician Assistant

## 2024-11-21 ENCOUNTER — Encounter: Payer: Self-pay | Admitting: Physician Assistant
